# Patient Record
Sex: Male | Born: 1970
Health system: Southern US, Community
[De-identification: ages and names within clinical notes are randomized; demographics above are authoritative.]

## PROBLEM LIST (undated history)

## (undated) DIAGNOSIS — E785 Hyperlipidemia, unspecified: Secondary | ICD-10-CM

## (undated) HISTORY — PX: TYMPANOSTOMY TUBE PLACEMENT: SHX32

## (undated) HISTORY — DX: Hyperlipidemia, unspecified: E78.5

---

## 2006-08-19 ENCOUNTER — Emergency Department: Payer: Self-pay | Admitting: Emergency Medicine

## 2008-01-18 ENCOUNTER — Ambulatory Visit: Payer: Self-pay | Admitting: Internal Medicine

## 2008-11-18 ENCOUNTER — Ambulatory Visit: Payer: Self-pay | Admitting: Gastroenterology

## 2009-04-16 IMAGING — US ABDOMEN ULTRASOUND
1 series · 17 of 25 positions shown · non-contrast
Comparison: none

REASON FOR EXAM: elevated LFTs
COMMENTS:

[Series 1: abdomen ultrasound · 17 of 63 slices shown]
[im 1/63]
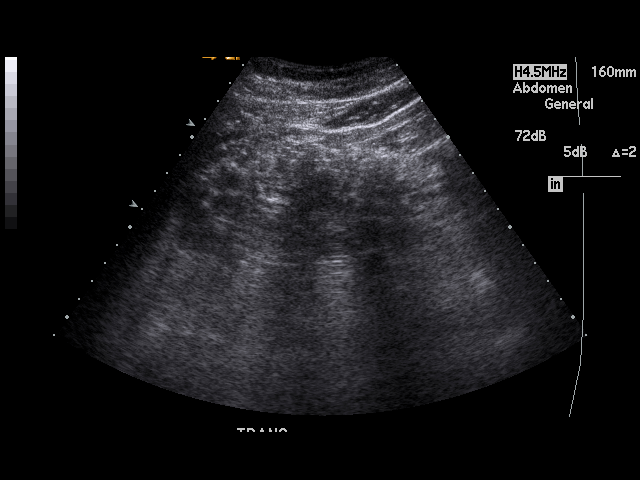
[im 6/63]
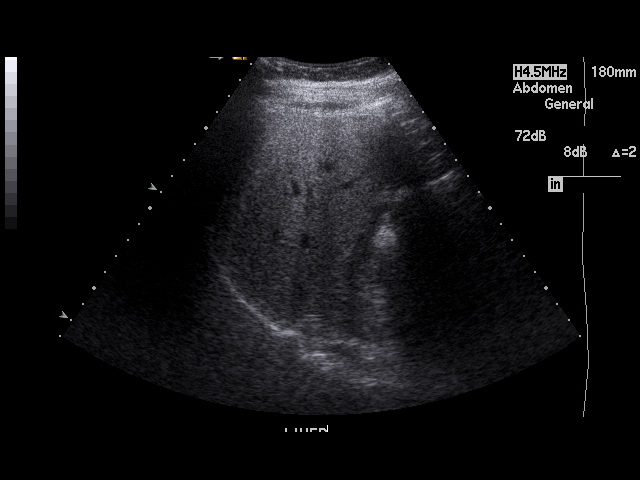
[im 8/63]
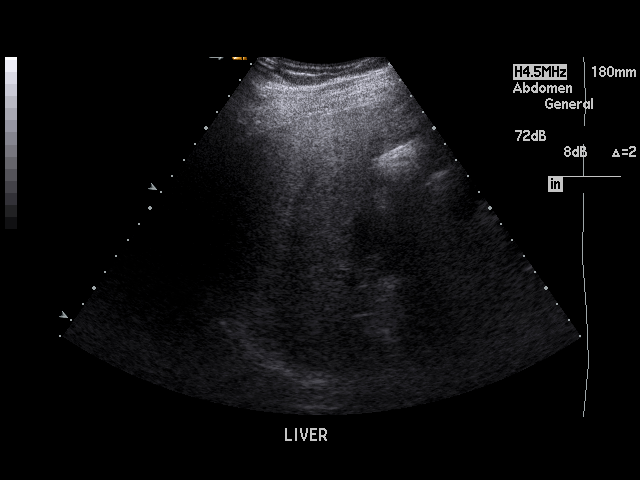
[im 13/63]
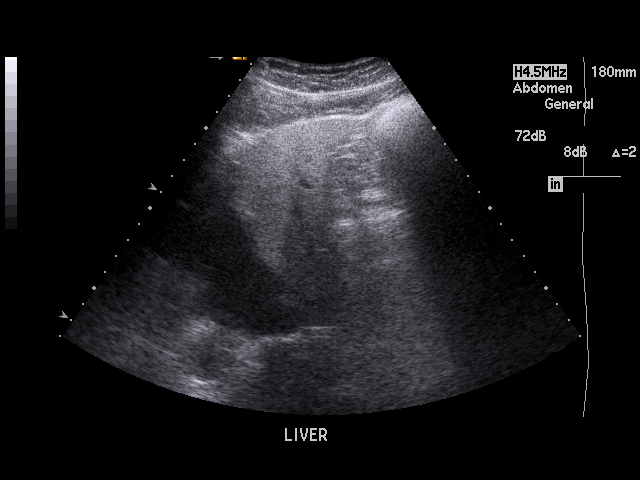
[im 16/63]
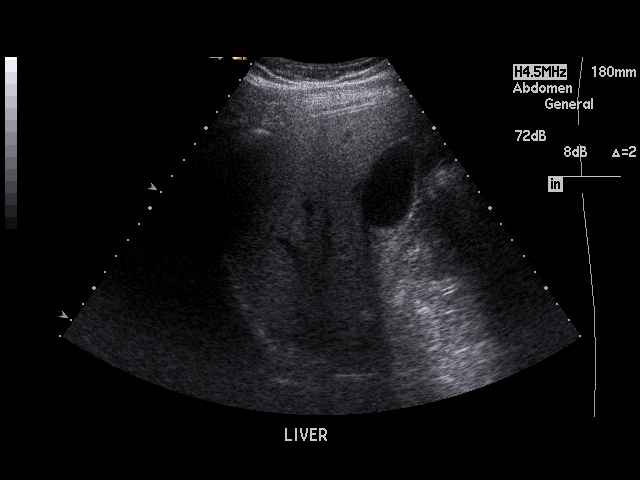
[im 21/63]
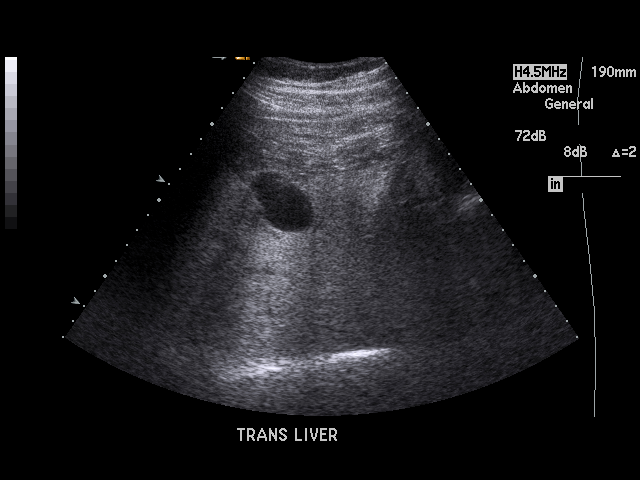
[im 24/63]
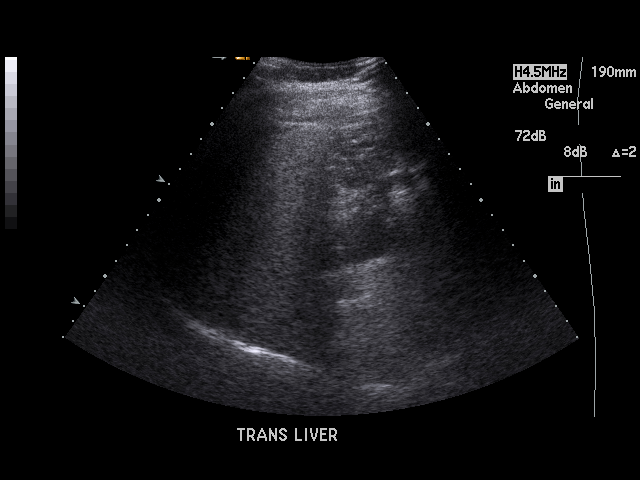
[im 29/63]
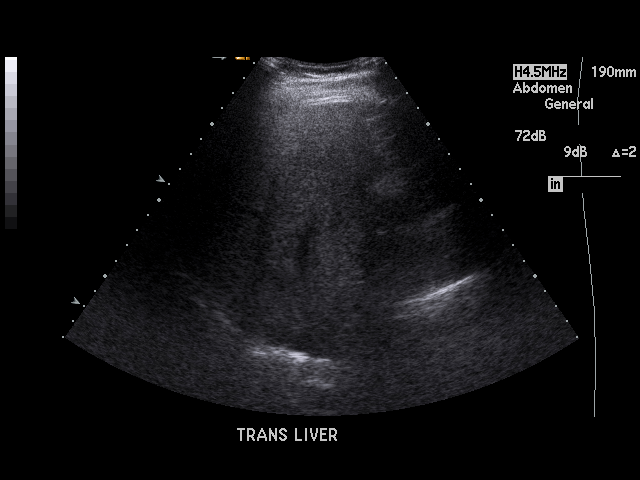
[im 32/63]
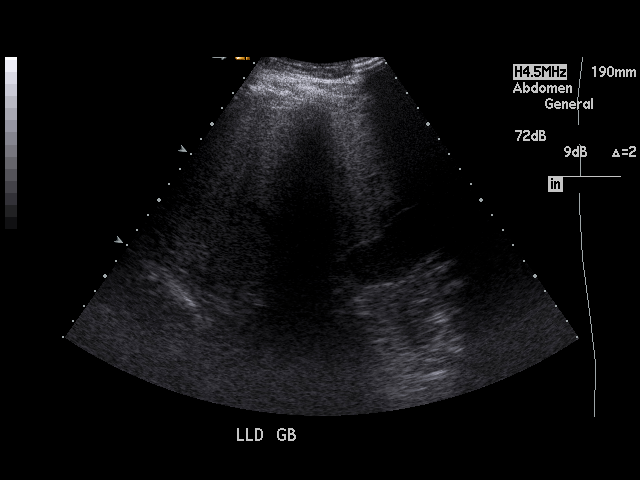
[im 34/63]
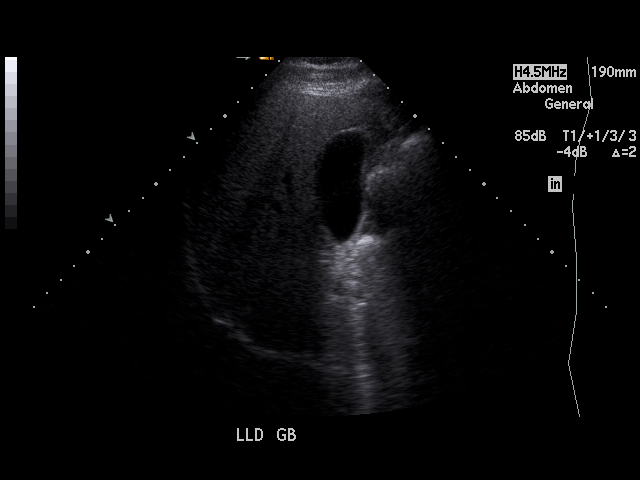
[im 39/63]
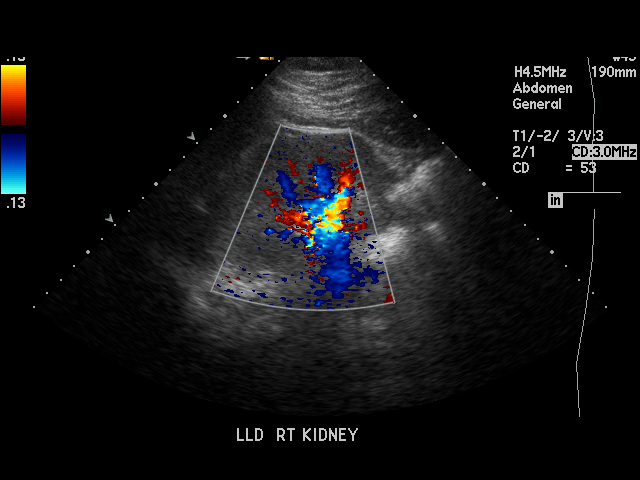
[im 42/63]
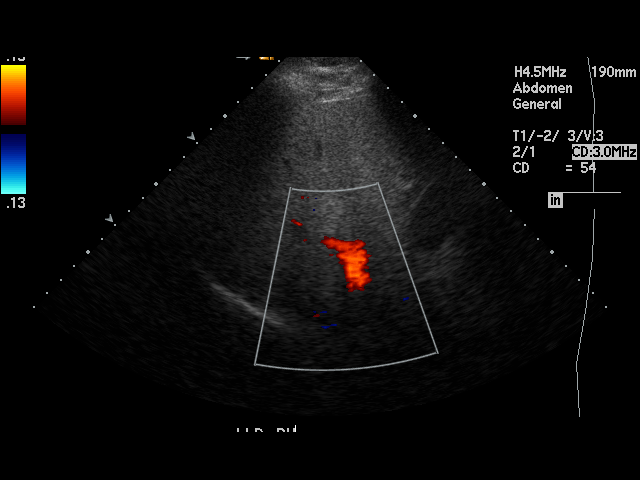
[im 47/63]
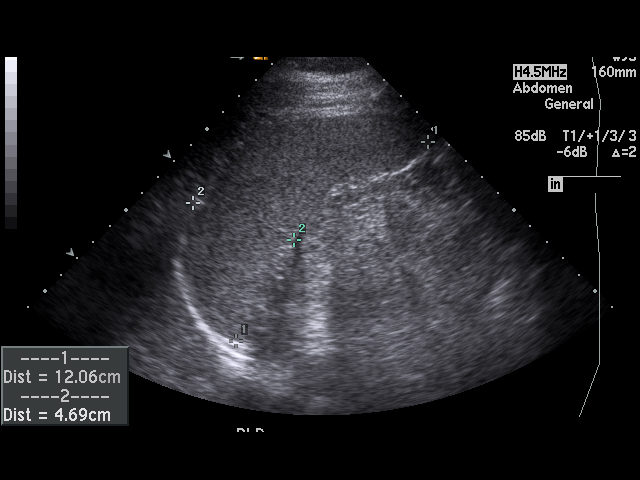
[im 50/63]
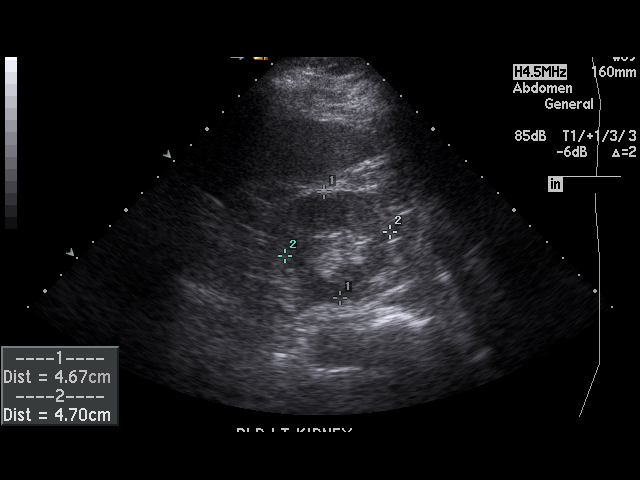
[im 55/63]
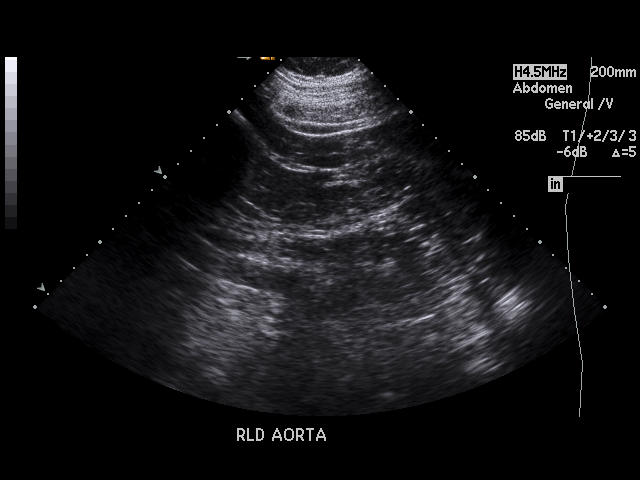
[im 57/63]
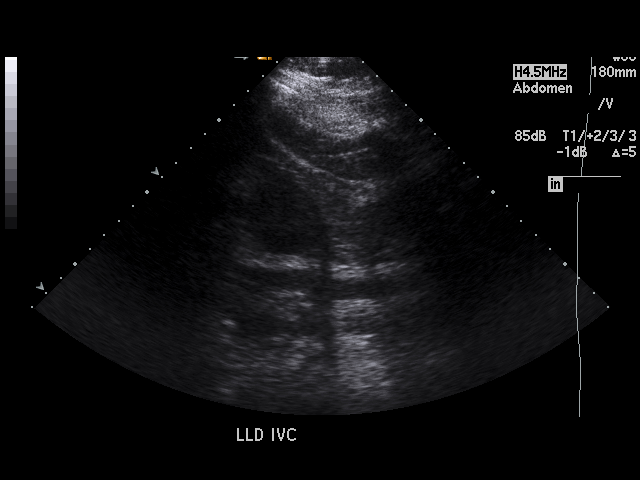
[im 63/63]
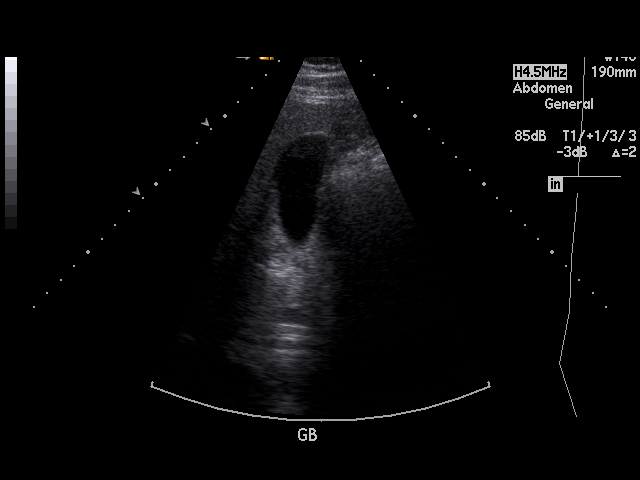

[17 of 25 positions shown; findings below may reference images not displayed]

PROCEDURE:     US  - US ABDOMEN GENERAL SURVEY  - January 18, 2008  [DATE]

RESULT:     Sonographic evaluation of the abdomen demonstrates
nonvisualization of the pancreas. The liver shows a slightly increased
echogenicity diffusely. The common bile duct diameter is 4.8 mm. The
gallbladder is normal in appearance with a 1 mm wall thickness. The spleen
is borderline enlarged at 12.5 cm in length. The inferior vena cava,
kidneys, and aorta appear to be unremarkable.
IMPRESSION: 1.     No evidence of cholelithiasis.
2.     Borderline splenomegaly. Nonvisualization of the pancreas. The liver
is slightly increased in echogenicity with no focal mass or hepatomegaly
demonstrated.

## 2012-08-20 ENCOUNTER — Ambulatory Visit: Payer: Self-pay | Admitting: Internal Medicine

## 2016-07-29 DIAGNOSIS — Z0001 Encounter for general adult medical examination with abnormal findings: Secondary | ICD-10-CM | POA: Diagnosis not present

## 2016-10-19 DIAGNOSIS — Z6827 Body mass index (BMI) 27.0-27.9, adult: Secondary | ICD-10-CM | POA: Diagnosis not present

## 2016-10-19 DIAGNOSIS — R29810 Facial weakness: Secondary | ICD-10-CM | POA: Diagnosis not present

## 2016-10-19 DIAGNOSIS — G510 Bell's palsy: Secondary | ICD-10-CM | POA: Diagnosis not present

## 2017-08-04 DIAGNOSIS — E782 Mixed hyperlipidemia: Secondary | ICD-10-CM | POA: Diagnosis not present

## 2017-08-04 DIAGNOSIS — G510 Bell's palsy: Secondary | ICD-10-CM | POA: Diagnosis not present

## 2017-08-04 DIAGNOSIS — Z0001 Encounter for general adult medical examination with abnormal findings: Secondary | ICD-10-CM | POA: Diagnosis not present

## 2017-10-06 ENCOUNTER — Ambulatory Visit: Payer: Self-pay | Admitting: Nurse Practitioner

## 2017-10-19 ENCOUNTER — Encounter: Payer: Self-pay | Admitting: Nurse Practitioner

## 2017-10-19 ENCOUNTER — Ambulatory Visit: Payer: BLUE CROSS/BLUE SHIELD | Admitting: Nurse Practitioner

## 2017-10-19 VITALS — BP 130/90 | HR 60 | Resp 16 | Ht 68.0 in | Wt 189.4 lb

## 2017-10-19 DIAGNOSIS — D485 Neoplasm of uncertain behavior of skin: Secondary | ICD-10-CM | POA: Diagnosis not present

## 2017-10-19 NOTE — Progress Notes (Addendum)
[facility] [place], [place] [address]  Internal MEDICINE  Office Visit Note  Patient Name: Joshua Cooke  [id number]  [id number]  Date of Service: 10/19/2017  Chief Complaint  Patient presents with  . Mass    right side forehear, near hair line    Other  This is a new problem. The current episode started 1 to 4 weeks ago. The problem occurs constantly. The problem has been gradually worsening. Pertinent negatives include no abdominal pain, arthralgias, chest pain, chills, congestion, coughing, headaches, myalgias, nausea, neck pain or vomiting. Nothing aggravates the symptoms. He has tried nothing for the symptoms.    Pt is here for routine follow up.    Current Medication: No outpatient encounter medications on file as of 10/19/2017.   No facility-administered encounter medications on file as of 10/19/2017.     Surgical History: Past Surgical History:  Procedure Laterality Date  . TYMPANOSTOMY TUBE PLACEMENT Bilateral     Medical History: Past Medical History:  Diagnosis Date  . Hyperlipidemia     Family History: Family History  Problem Relation Age of Onset  . Hyperlipidemia Father   . Heart disease Father     Social History   Socioeconomic History  . Marital status: Married    Spouse name: Not on file  . Number of children: Not on file  . Years of education: Not on file  . Highest education level: Not on file  Social Needs  . Financial resource strain: Not on file  . Food insecurity - worry: Not on file  . Food insecurity - inability: Not on file  . Transportation needs - medical: Not on file  . Transportation needs - non-medical: Not on file  Occupational History  . Not on file  Tobacco Use  . Smoking status: Never Smoker  . Smokeless tobacco: Never Used  Substance and Sexual Activity  . Alcohol use: Yes    Alcohol/week: 1.8 oz    Types: 3 Cans of beer per week    Frequency: Never  . Drug use: Not on file  . Sexual  activity: Not on file  Other Topics Concern  . Not on file  Social History Narrative  . Not on file      Review of Systems  Constitutional: Negative for activity change, chills and unexpected weight change.  HENT: Negative for congestion, postnasal drip, rhinorrhea and tinnitus.   Respiratory: Negative for cough and wheezing.   Cardiovascular: Negative for chest pain and palpitations.  Gastrointestinal: Negative for abdominal pain, diarrhea, nausea and vomiting.  Genitourinary: Negative.   Musculoskeletal: Negative for arthralgias, back pain, myalgias and neck pain.  Skin:       Skin lesion on right side of forehead, adjacent to hair line.   Allergic/Immunologic: Negative.   Neurological: Negative for headaches.  Psychiatric/Behavioral: Negative for agitation and dysphoric mood.    Today's Vitals   10/19/17 1039  BP: 130/90  Pulse: 60  Resp: 16  SpO2: 98%  Weight: 189 lb 6.4 oz (85.9 kg)  Height: 5\' 8"  (1.727 m)   Physical Exam  Constitutional: He is oriented to person, place, and time. He appears well-developed and well-nourished.  HENT:  Head: Normocephalic.  Eyes: Pupils are equal, round, and reactive to light.  Neck: Normal range of motion. Neck supple. No thyromegaly present.  Cardiovascular: Normal rate and regular rhythm.  Pulmonary/Chest: Effort normal and breath sounds normal. He has no wheezes.  Abdominal: Soft. There is no tenderness.  Musculoskeletal:  Normal range of motion.  Neurological: He is alert and oriented to person, place, and time.  Skin: Skin is warm and dry.     Psychiatric: He has a normal mood and affect. His behavior is normal. Judgment and thought content normal.    Assessment/Plan: 1. Neoplasm of uncertain behavior of skin of face - Ambulatory referral to Dermatology   General Counseling: [name] understanding of the findings of todays visit and agrees with plan of treatment. I have discussed any further diagnostic  evaluation that may be needed or ordered today. We also reviewed his medications today. he has been encouraged to call the office with any questions or concerns that should arise related to todays visit.   Orders Placed This Encounter  Procedures  . Ambulatory referral to Dermatology      Time spent: 10 Minutes    Dr Lavera Guise Internal medicine

## 2017-11-24 DIAGNOSIS — L820 Inflamed seborrheic keratosis: Secondary | ICD-10-CM | POA: Diagnosis not present

## 2017-11-24 DIAGNOSIS — D225 Melanocytic nevi of trunk: Secondary | ICD-10-CM | POA: Diagnosis not present

## 2017-11-24 DIAGNOSIS — D485 Neoplasm of uncertain behavior of skin: Secondary | ICD-10-CM | POA: Diagnosis not present

## 2017-11-24 DIAGNOSIS — L570 Actinic keratosis: Secondary | ICD-10-CM | POA: Diagnosis not present

## 2018-05-11 ENCOUNTER — Ambulatory Visit
Admission: RE | Admit: 2018-05-11 | Discharge: 2018-05-11 | Disposition: A | Discharge status: Home / Self Care | Payer: BLUE CROSS/BLUE SHIELD | Source: Ambulatory Visit | Attending: Adult Health | Admitting: Adult Health

## 2018-05-11 ENCOUNTER — Encounter: Payer: Self-pay | Admitting: Adult Health

## 2018-05-11 ENCOUNTER — Ambulatory Visit: Payer: BLUE CROSS/BLUE SHIELD | Admitting: Adult Health

## 2018-05-11 VITALS — BP 122/82 | HR 46 | Temp 98.0°F | Resp 16 | Ht 68.0 in | Wt 187.0 lb

## 2018-05-11 DIAGNOSIS — Z8669 Personal history of other diseases of the nervous system and sense organs: Secondary | ICD-10-CM | POA: Diagnosis not present

## 2018-05-11 DIAGNOSIS — E785 Hyperlipidemia, unspecified: Secondary | ICD-10-CM

## 2018-05-11 DIAGNOSIS — R221 Localized swelling, mass and lump, neck: Secondary | ICD-10-CM

## 2018-05-11 NOTE — Progress Notes (Signed)
[facility] [place], [place] [address]  Internal MEDICINE  Office Visit Note  Patient Name: Joshua Cooke  [id number]  [id number]  Date of Service: 05/27/2018  Chief Complaint  Patient presents with  . Cyst    upper back came up all of a sudden last Friday after using a bar for squats      HPI Pt is here for a sick visit.  Pt reports an area of swelling he describes as a growth to his neck, upper back area.  He reports his wife noticed this a week ago.  He denies growth of area since its initial discovery.  He denies pain with movement or palpation.  The area does not feel hot to him. He does go to the gym on thursdays and he did squat with a bar across his shoulder.  He does not recall an instance of pain during lifting.     Current Medication:  No outpatient encounter medications on file as of 05/11/2018.   No facility-administered encounter medications on file as of 05/11/2018.    Medical History: Past Medical History:  Diagnosis Date  . Hyperlipidemia    Vital Signs: BP 122/82   Pulse (!) 46   Temp 98 F (36.7 C)   Resp 16   Ht 5\' 8"  (1.727 m)   Wt 187 lb (84.8 kg)   SpO2 97%   BMI 28.43 kg/m   Review of Systems  Constitutional: Negative.  Negative for chills, fatigue and unexpected weight change.  HENT: Negative.  Negative for congestion, rhinorrhea, sneezing and sore throat.   Eyes: Negative for redness.  Respiratory: Negative.  Negative for cough, chest tightness and shortness of breath.   Cardiovascular: Negative.  Negative for chest pain and palpitations.  Gastrointestinal: Negative.  Negative for abdominal pain, constipation, diarrhea, nausea and vomiting.  Endocrine: Negative.   Genitourinary: Negative.  Negative for dysuria and frequency.  Musculoskeletal: Negative.  Negative for arthralgias, back pain, joint swelling and neck pain.  Skin: Negative.  Negative for rash.  Allergic/Immunologic: Negative.   Neurological:  Negative.  Negative for tremors and numbness.  Hematological: Negative for adenopathy. Does not bruise/bleed easily.  Psychiatric/Behavioral: Negative.  Negative for behavioral problems, sleep disturbance and suicidal ideas. The patient is not nervous/anxious.    Physical Exam  Constitutional: He is oriented to person, place, and time. He appears well-developed and well-nourished. No distress.  HENT:  Head: Normocephalic and atraumatic.  Mouth/Throat: Oropharynx is clear and moist. No oropharyngeal exudate.  Eyes: Pupils are equal, round, and reactive to light. EOM are normal.  Neck: Normal range of motion. Neck supple. No JVD present. No tracheal deviation present. No thyromegaly present.  Cardiovascular: Normal rate, regular rhythm and normal heart sounds. Exam reveals no gallop and no friction rub.  No murmur heard. Pulmonary/Chest: Effort normal and breath sounds normal. No respiratory distress. He has no wheezes. He has no rales. He exhibits no tenderness.  Abdominal: Soft. There is no tenderness. There is no guarding.  Musculoskeletal: Normal range of motion.  Lymphadenopathy:    He has no cervical adenopathy.  Neurological: He is alert and oriented to person, place, and time. No cranial nerve deficit.  Skin: Skin is warm and dry. He is not diaphoretic.  Psychiatric: He has a normal mood and affect. His behavior is normal. Judgment and thought content normal.  Nursing note and vitals reviewed.  Assessment/Plan: 1. Neck mass Small swelling, questionable lipoma or body habitus Will evaluate further.  -  DG Cervical Spine Complete; Future - US Soft Tissue Head/Neck; Future  2. Hyperlipidemia, unspecified hyperlipidemia type Stable with diet and exercise.   3. Hx of Bell's palsy Noticed physical residual effects, facial asymmetry.   General Counseling: calbert hulsebus understanding of the findings of todays visit and agrees with plan of treatment. I have discussed any further  diagnostic evaluation that may be needed or ordered today. We also reviewed his medications today. he has been encouraged to call the office with any questions or concerns that should arise related to todays visit.   Orders Placed This Encounter  Procedures  . US Soft Tissue Head/Neck  . DG Neck Soft Tissue    Time spent: 25 Minutes  This patient was seen by Orson Gear AGNP-C in Collaboration with Dr Lavera Guise as a part of collaborative care agreement

## 2018-05-11 NOTE — Patient Instructions (Addendum)
X-rays X-rays are tests that create pictures of the inside of your body using radiation. Different body parts absorb different amounts of radiation, which show up on the X-ray pictures in shades of black, gray, and white. X-rays are used to look for many health conditions, including broken bones, lung problems, and some types of cancer. Tell a health care provider about:  Any allergies you have.  All medicines you are taking, including vitamins, herbs, eye drops, creams, and over-the-counter medicines.  Previous surgeries you have had.  Medical conditions you have. What are the risks? Getting an X-ray is a safe procedure. What happens before the procedure?  Tell the X-ray technician if you are pregnant or might be pregnant.  You may be asked to wear a protective lead apron to hide parts of your body from the X-ray.  You usually will need to undress whatever part of your body needs the X-ray. You will be given a hospital gown to wear, if needed.  You may need to remove your glasses, jewelry, and other metal objects. What happens during the procedure?  The X-ray machine creates a picture by using a tiny burst of radiation. It is painless.  You may need to have several pictures taken at different angles.  You will need to try to be as still as you can during the examination to get the best possible images. What happens after the procedure?  You will be able to resume your normal activities.  The X-ray will be examined by your health care provider or a radiology specialist.  It is your responsibility to get your test results. Ask your health care provider when to expect your results and how to get them. This information is not intended to replace advice given to you by your health care provider. Make sure you discuss any questions you have with your health care provider. Document Released: 09/05/2005 Document Revised: 05/05/2016 Document Reviewed: 10/30/2013 Elsevier Interactive  Patient Education  2018 [employer].  Cholesterol Cholesterol is a fat. Your body needs a small amount of cholesterol. Cholesterol (plaque) may build up in your blood vessels (arteries). That makes you more likely to have a heart attack or stroke. You cannot feel your cholesterol level. Having a blood test is the only way to find out if your level is high. Keep your test results. Work with your doctor to keep your cholesterol at a good level. What do the results mean?  Total cholesterol is how much cholesterol is in your blood.  LDL is bad cholesterol. This is the type that can build up. Try to have low LDL.  HDL is good cholesterol. It cleans your blood vessels and carries LDL away. Try to have high HDL.  Triglycerides are fat that the body can store or burn for energy. What are good levels of cholesterol?  Total cholesterol below 200.  LDL below 100 is good for people who have health risks. LDL below 70 is good for people who have very high risks.  HDL above 40 is good. It is best to have HDL of 60 or higher.  Triglycerides below 150. How can I lower my cholesterol? Diet Follow your diet program as told by your doctor.  Choose fish, white meat chicken, or [place] that is roasted or baked. Try not to eat red meat, fried foods, sausage, or lunch meats.  Eat lots of fresh fruits and vegetables.  Choose whole grains, beans, pasta, potatoes, and cereals.  Choose olive oil, corn oil, or canola  oil. Only use small amounts.  Try not to eat butter, mayonnaise, shortening, or palm kernel oils.  Try not to eat foods with trans fats.  Choose low-fat or nonfat dairy foods. ? Drink skim or nonfat milk. ? Eat low-fat or nonfat yogurt and cheeses. ? Try not to drink whole milk or cream. ? Try not to eat ice cream, egg yolks, or full-fat cheeses.  Healthy desserts include angel food cake, ginger snaps, animal crackers, hard candy, popsicles, and low-fat or nonfat frozen yogurt. Try not  to eat pastries, cakes, pies, and cookies.  Exercise Follow your exercise program as told by your doctor.  Be more active. Try gardening, walking, and taking the stairs.  Ask your doctor about ways that you can be more active.  Medicine  Take over-the-counter and prescription medicines only as told by your doctor. This information is not intended to replace advice given to you by your health care provider. Make sure you discuss any questions you have with your health care provider. Document Released: 12/02/2008 Document Revised: 04/06/2016 Document Reviewed: 03/17/2016 Elsevier Interactive Patient Education  [employer].

## 2018-05-25 ENCOUNTER — Other Ambulatory Visit: Payer: BLUE CROSS/BLUE SHIELD

## 2018-05-28 ENCOUNTER — Ambulatory Visit: Payer: Self-pay | Admitting: Adult Health

## 2018-06-01 ENCOUNTER — Ambulatory Visit: Payer: BLUE CROSS/BLUE SHIELD

## 2018-06-01 DIAGNOSIS — R221 Localized swelling, mass and lump, neck: Secondary | ICD-10-CM

## 2018-06-04 ENCOUNTER — Ambulatory Visit: Payer: BLUE CROSS/BLUE SHIELD | Admitting: Adult Health

## 2018-06-04 ENCOUNTER — Encounter: Payer: Self-pay | Admitting: Adult Health

## 2018-06-04 VITALS — BP 128/78 | HR 50 | Resp 16 | Ht 68.0 in | Wt 184.4 lb

## 2018-06-04 DIAGNOSIS — R001 Bradycardia, unspecified: Secondary | ICD-10-CM | POA: Diagnosis not present

## 2018-06-04 DIAGNOSIS — E65 Localized adiposity: Secondary | ICD-10-CM

## 2018-06-04 DIAGNOSIS — E785 Hyperlipidemia, unspecified: Secondary | ICD-10-CM

## 2018-06-04 NOTE — Patient Instructions (Signed)
Lipoma A lipoma is a noncancerous (benign) tumor that is made up of fat cells. This is a very common type of soft-tissue growth. Lipomas are usually found under the skin (subcutaneous). They may occur in any tissue of the body that contains fat. Common areas for lipomas to appear include the back, shoulders, buttocks, and thighs. Lipomas grow slowly, and they are usually painless. Most lipomas do not cause problems and do not require treatment. What are the causes? The cause of this condition is not known. What increases the risk? This condition is more likely to develop in:  People who are 40-60 years old.  People who have a family history of lipomas.  What are the signs or symptoms? A lipoma usually appears as a small, round bump under the skin. It may feel soft or rubbery, but the firmness can vary. Most lipomas are not painful. However, a lipoma may become painful if it is located in an area where it pushes on nerves. How is this diagnosed? A lipoma can usually be diagnosed with a physical exam. You may also have tests to confirm the diagnosis and to rule out other conditions. Tests may include:  Imaging tests, such as a CT scan or MRI.  Removal of a tissue sample to be looked at under a microscope (biopsy).  How is this treated? Treatment is not needed for small lipomas that are not causing problems. If a lipoma continues to get bigger or it causes problems, removal is often the best option. Lipomas can also be removed to improve appearance. Removal of a lipoma is usually done with a surgery in which the fatty cells and the surrounding capsule are removed. Most often, a medicine that numbs the area (local anesthetic) is used for this procedure. Follow these instructions at home:  Keep all follow-up visits as directed by your health care provider. This is important. Contact a health care provider if:  Your lipoma becomes larger or hard.  Your lipoma becomes painful, red, or  increasingly swollen. These could be signs of infection or a more serious condition. This information is not intended to replace advice given to you by your health care provider. Make sure you discuss any questions you have with your health care provider. Document Released: 08/26/2002 Document Revised: 02/11/2016 Document Reviewed: 09/01/2014 Elsevier Interactive Patient Education  2018 Elsevier Inc.  

## 2018-06-04 NOTE — Progress Notes (Signed)
[facility] [place], [place] [address]  Internal MEDICINE  Office Visit Note  Patient Name: Joshua Cooke  [id number]  [id number]  Date of Service: 06/11/2018  Chief Complaint  Patient presents with  . Medical Management of Chronic Issues    follow up    HPI Pt here for follow up on X-ray and Ultrasound.  Pt has mass between his shoulder blades at the base of his neck. X-ray reports it is subcutaneous fat pad.  Pt denies other symptoms at this time.  The area appears to have diminished some in the last few weeks.  He denies pain, or difficulty with neck movement.  Current Medication: No outpatient encounter medications on file as of 06/04/2018.   No facility-administered encounter medications on file as of 06/04/2018.     Surgical History: Past Surgical History:  Procedure Laterality Date  . TYMPANOSTOMY TUBE PLACEMENT Bilateral     Medical History: Past Medical History:  Diagnosis Date  . Hyperlipidemia     Family History: Family History  Problem Relation Age of Onset  . Hyperlipidemia Father   . Heart disease Father     Social History   Socioeconomic History  . Marital status: Married    Spouse name: Not on file  . Number of children: Not on file  . Years of education: Not on file  . Highest education level: Not on file  Occupational History  . Not on file  Social Needs  . Financial resource strain: Not on file  . Food insecurity:    Worry: Not on file    Inability: Not on file  . Transportation needs:    Medical: Not on file    Non-medical: Not on file  Tobacco Use  . Smoking status: Never Smoker  . Smokeless tobacco: Never Used  Substance and Sexual Activity  . Alcohol use: Yes    Alcohol/week: 3.0 standard drinks    Types: 3 Cans of beer per week    Frequency: Never  . Drug use: Not on file  . Sexual activity: Not on file  Lifestyle  . Physical activity:    Days per week: Not on file    Minutes per session: Not on  file  . Stress: Not on file  Relationships  . Social connections:    Talks on phone: Not on file    Gets together: Not on file    Attends religious service: Not on file    Active member of club or organization: Not on file    Attends meetings of clubs or organizations: Not on file    Relationship status: Not on file  . Intimate partner violence:    Fear of current or ex partner: Not on file    Emotionally abused: Not on file    Physically abused: Not on file    Forced sexual activity: Not on file  Other Topics Concern  . Not on file  Social History Narrative  . Not on file      Review of Systems  Constitutional: Negative.  Negative for chills, fatigue and unexpected weight change.  HENT: Negative.  Negative for congestion, rhinorrhea, sneezing and sore throat.   Eyes: Negative for redness.  Respiratory: Negative.  Negative for cough, chest tightness and shortness of breath.   Cardiovascular: Negative.  Negative for chest pain and palpitations.  Gastrointestinal: Negative.  Negative for abdominal pain, constipation, diarrhea, nausea and vomiting.  Endocrine: Negative.   Genitourinary: Negative.  Negative for dysuria  and frequency.  Musculoskeletal: Negative.  Negative for arthralgias, back pain, joint swelling and neck pain.  Skin: Negative.  Negative for rash.  Allergic/Immunologic: Negative.   Neurological: Negative.  Negative for tremors and numbness.  Hematological: Negative for adenopathy. Does not bruise/bleed easily.  Psychiatric/Behavioral: Negative.  Negative for behavioral problems, sleep disturbance and suicidal ideas. The patient is not nervous/anxious.     Vital Signs: BP 128/78   Pulse (!) 50   Resp 16   Ht 5\' 8"  (1.727 m)   Wt 184 lb 6.4 oz (83.6 kg)   SpO2 97%   BMI 28.04 kg/m    Physical Exam  Constitutional: He is oriented to person, place, and time. He appears well-developed and well-nourished. No distress.  HENT:  Head: Normocephalic and  atraumatic.  Mouth/Throat: Oropharynx is clear and moist. No oropharyngeal exudate.  Eyes: Pupils are equal, round, and reactive to light. EOM are normal.  Neck: Normal range of motion. Neck supple. No JVD present. No tracheal deviation present. No thyromegaly present.  Cardiovascular: Normal rate, regular rhythm and normal heart sounds. Exam reveals no gallop and no friction rub.  No murmur heard. Pulmonary/Chest: Effort normal and breath sounds normal. No respiratory distress. He has no wheezes. He has no rales. He exhibits no tenderness.  Abdominal: Soft. There is no tenderness. There is no guarding.  Musculoskeletal: Normal range of motion.  Lymphadenopathy:    He has no cervical adenopathy.  Neurological: He is alert and oriented to person, place, and time. No cranial nerve deficit.  Skin: Skin is warm and dry. He is not diaphoretic.  Psychiatric: He has a normal mood and affect. His behavior is normal. Judgment and thought content normal.  Nursing note and vitals reviewed.  Assessment/Plan: 1. [place] hump Lab work to be drawn to assess for [place]. Also normal physical labs drawn for physical in November.  - Cortisol-am, blood - Catecholamines, Fractionated, Plasma - CBC with Differential/Platelet - Lipid Panel With LDL/HDL Ratio - TSH - T4, free - Comprehensive metabolic panel - PSA  2. Hyperlipidemia, unspecified hyperlipidemia type Will follow lab results.   3. Bradycardia Noted at visit today.  Pt runs 13 miles a week and cycles so is cardiovascular fit.   General Counseling: kedrick mcnamee understanding of the findings of todays visit and agrees with plan of treatment. I have discussed any further diagnostic evaluation that may be needed or ordered today. We also reviewed his medications today. he has been encouraged to call the office with any questions or concerns that should arise related to todays visit.    Orders Placed This Encounter  Procedures  .  Cortisol-am, blood  . Catecholamines, Fractionated, Plasma  . CBC with Differential/Platelet  . Lipid Panel With LDL/HDL Ratio  . TSH  . T4, free  . Comprehensive metabolic panel  . PSA     Time spent: 25 Minutes   This patient was seen by Orson Gear AGNP-C in Collaboration with Dr Lavera Guise as a part of collaborative care agreement    Dr Lavera Guise Internal medicine

## 2018-07-31 DIAGNOSIS — Z0001 Encounter for general adult medical examination with abnormal findings: Secondary | ICD-10-CM | POA: Diagnosis not present

## 2018-08-03 LAB — CBC WITH DIFFERENTIAL/PLATELET
BASOS ABS: 0 10*3/uL (ref 0.0–0.2)
Basos: 1 %
EOS (ABSOLUTE): 0.2 10*3/uL (ref 0.0–0.4)
EOS: 5 %
HEMOGLOBIN: 14.5 g/dL (ref 13.0–17.7)
Hematocrit: 41.5 % (ref 37.5–51.0)
IMMATURE GRANS (ABS): 0 10*3/uL (ref 0.0–0.1)
IMMATURE GRANULOCYTES: 0 %
LYMPHS ABS: 0.9 10*3/uL (ref 0.7–3.1)
LYMPHS: 24 %
MCH: 32 pg (ref 26.6–33.0)
MCHC: 34.9 g/dL (ref 31.5–35.7)
MCV: 92 fL (ref 79–97)
Monocytes Absolute: 0.3 10*3/uL (ref 0.1–0.9)
Monocytes: 7 %
NEUTROS PCT: 63 %
Neutrophils Absolute: 2.2 10*3/uL (ref 1.4–7.0)
Platelets: 173 10*3/uL (ref 150–450)
RBC: 4.53 x10E6/uL (ref 4.14–5.80)
RDW: 11.7 % — ABNORMAL LOW (ref 12.3–15.4)
WBC: 3.5 10*3/uL (ref 3.4–10.8)

## 2018-08-03 LAB — COMPREHENSIVE METABOLIC PANEL
A/G RATIO: 1.9 (ref 1.2–2.2)
ALBUMIN: 4.5 g/dL (ref 3.5–5.5)
ALT: 34 IU/L (ref 0–44)
AST: 40 IU/L (ref 0–40)
Alkaline Phosphatase: 62 IU/L (ref 39–117)
BUN / CREAT RATIO: 14 (ref 9–20)
BUN: 16 mg/dL (ref 6–24)
Bilirubin Total: 0.8 mg/dL (ref 0.0–1.2)
CALCIUM: 9.3 mg/dL (ref 8.7–10.2)
CO2: 23 mmol/L (ref 20–29)
Chloride: 103 mmol/L (ref 96–106)
Creatinine, Ser: 1.14 mg/dL (ref 0.76–1.27)
GFR calc Af Amer: 87 mL/min/{1.73_m2} (ref >59)
GFR, EST NON AFRICAN AMERICAN: 75 mL/min/{1.73_m2} (ref >59)
GLUCOSE: 95 mg/dL (ref 65–99)
Globulin, Total: 2.4 g/dL (ref 1.5–4.5)
Potassium: 4.5 mmol/L (ref 3.5–5.2)
Sodium: 140 mmol/L (ref 134–144)
TOTAL PROTEIN: 6.9 g/dL (ref 6.0–8.5)

## 2018-08-03 LAB — CORTISOL-AM, BLOOD: Cortisol - AM: 11 ug/dL (ref 6.2–19.4)

## 2018-08-03 LAB — LIPID PANEL WITH LDL/HDL RATIO
CHOLESTEROL TOTAL: 160 mg/dL (ref 100–199)
HDL: 53 mg/dL (ref >39)
LDL CALC: 94 mg/dL (ref 0–99)
LDl/HDL Ratio: 1.8 ratio (ref 0.0–3.6)
TRIGLYCERIDES: 64 mg/dL (ref 0–149)
VLDL Cholesterol Cal: 13 mg/dL (ref 5–40)

## 2018-08-03 LAB — CATECHOLAMINES, FRACTIONATED, PLASMA
Dopamine: <30 pg/mL (ref 0–48)
EPINEPHRINE: 52 pg/mL (ref 0–62)
Norepinephrine: 462 pg/mL (ref 0–874)

## 2018-08-03 LAB — PSA: Prostate Specific Ag, Serum: 0.6 ng/mL (ref 0.0–4.0)

## 2018-08-03 LAB — TSH: TSH: 1.71 u[IU]/mL (ref 0.450–4.500)

## 2018-08-03 LAB — T4, FREE: FREE T4: 1.08 ng/dL (ref 0.82–1.77)

## 2018-08-06 ENCOUNTER — Ambulatory Visit (INDEPENDENT_AMBULATORY_CARE_PROVIDER_SITE_OTHER): Payer: BLUE CROSS/BLUE SHIELD | Admitting: Nurse Practitioner

## 2018-08-06 ENCOUNTER — Encounter: Payer: Self-pay | Admitting: Nurse Practitioner

## 2018-08-06 VITALS — BP 129/74 | HR 44 | Resp 16 | Ht 68.0 in | Wt 175.0 lb

## 2018-08-06 DIAGNOSIS — E785 Hyperlipidemia, unspecified: Secondary | ICD-10-CM

## 2018-08-06 DIAGNOSIS — D170 Benign lipomatous neoplasm of skin and subcutaneous tissue of head, face and neck: Secondary | ICD-10-CM

## 2018-08-06 DIAGNOSIS — Z0001 Encounter for general adult medical examination with abnormal findings: Secondary | ICD-10-CM | POA: Diagnosis not present

## 2018-08-06 NOTE — Progress Notes (Signed)
[facility] [place], [place] [address]  Internal MEDICINE  Office Visit Note  Patient Name: Joshua Cooke  [id number]  [id number]  Date of Service: 08/06/2018   Pt is here for routine health maintenance examination  Chief Complaint  Patient presents with  . Hyperlipidemia  . Annual Exam  . Other    growth on neck      The patient is here for health maintenance exam. He was seen back in September for development of a lump at the base of his neck. Has not enlarged, but does get in the way of him flexing and extending his neck. He did have ultrasound of this area and results are consistent with lipoma. Labs were also checked, evaluating him for cushing's and/or Addison's disease. Lab results were all normal. When compared to labs done through his place of employment, his liver functions and cholesterol panels had returned to normal. He states that he has cut back on the amount of beer he drinks. Not only did his labs improve, he dropped nine pounds. Besides the lump at the base of his neck, he has not concerns or complaints today.     Current Medication: No outpatient encounter medications on file as of 08/06/2018.   No facility-administered encounter medications on file as of 08/06/2018.     Surgical History: Past Surgical History:  Procedure Laterality Date  . TYMPANOSTOMY TUBE PLACEMENT Bilateral     Medical History: Past Medical History:  Diagnosis Date  . Hyperlipidemia     Family History: Family History  Problem Relation Age of Onset  . Hyperlipidemia Father   . Heart disease Father       Review of Systems  Constitutional: Positive for unexpected weight change. Negative for activity change, chills and fatigue.       Weight loss of nine pounds since his last visit.   HENT: Negative for congestion, rhinorrhea, sneezing and sore throat.   Eyes: Negative for redness.  Respiratory: Negative for cough, chest tightness, shortness of  breath and wheezing.   Cardiovascular: Negative for chest pain and palpitations.  Gastrointestinal: Negative for abdominal pain, constipation, diarrhea, nausea and vomiting.  Endocrine: Negative for cold intolerance, heat intolerance, polydipsia, polyphagia and polyuria.  Genitourinary: Negative.   Musculoskeletal: Positive for neck pain. Negative for arthralgias, back pain and joint swelling.  Skin: Negative for rash.  Allergic/Immunologic: Negative for environmental allergies.  Neurological: Negative for dizziness, tremors, numbness and headaches.  Hematological: Negative for adenopathy. Does not bruise/bleed easily.  Psychiatric/Behavioral: Negative for behavioral problems, dysphoric mood, sleep disturbance and suicidal ideas. The patient is not nervous/anxious.      Today's Vitals   08/06/18 1032  BP: 129/74  Pulse: (!) 44  Resp: 16  SpO2: 99%  Weight: 175 lb (79.4 kg)  Height: 5\' 8"  (1.727 m)    Physical Exam  Constitutional: He is oriented to person, place, and time. He appears well-developed and well-nourished. No distress.  HENT:  Head: Normocephalic and atraumatic.  Nose: Nose normal.  Mouth/Throat: No oropharyngeal exudate.  Eyes: Pupils are equal, round, and reactive to light. EOM are normal.  Neck: Normal range of motion. Neck supple. No JVD present. No tracheal deviation present. No thyromegaly present.    Cardiovascular: Normal rate, regular rhythm, normal heart sounds and intact distal pulses. Exam reveals no gallop and no friction rub.  No murmur heard. Pulmonary/Chest: Effort normal and breath sounds normal. No respiratory distress. He has no wheezes. He has no rales. He  exhibits no tenderness.  Abdominal: Soft. Bowel sounds are normal. There is no tenderness.  Musculoskeletal: Normal range of motion.  Lymphadenopathy:    He has no cervical adenopathy.  Neurological: He is alert and oriented to person, place, and time. No cranial nerve deficit.  Skin: Skin  is warm and dry. He is not diaphoretic.  Psychiatric: He has a normal mood and affect. His behavior is normal. Judgment and thought content normal.  Nursing note and vitals reviewed.    LABS: Recent Results (from the past 2160 hour(s))  Cortisol-am, blood     Status: None   Collection Time: 07/31/18  8:41 AM  Result Value Ref Range   Cortisol - AM 11.0 6.2 - 19.4 ug/dL  Catecholamines, Fractionated, Plasma     Status: None   Collection Time: 07/31/18  8:41 AM  Result Value Ref Range   Norepinephrine 462 0 - 874 pg/mL   Epinephrine 52 0 - 62 pg/mL   Dopamine <30 0 - 48 pg/mL  CBC with Differential/Platelet     Status: Abnormal   Collection Time: 07/31/18  8:41 AM  Result Value Ref Range   WBC 3.5 3.4 - 10.8 x10E3/uL   RBC 4.53 4.14 - 5.80 x10E6/uL   Hemoglobin 14.5 13.0 - 17.7 g/dL   Hematocrit 41.5 37.5 - 51.0 %   MCV 92 79 - 97 fL   MCH 32.0 26.6 - 33.0 pg   MCHC 34.9 31.5 - 35.7 g/dL   RDW 11.7 (L) 12.3 - 15.4 %   Platelets 173 150 - 450 x10E3/uL   Neutrophils 63 Not Estab. %   Lymphs 24 Not Estab. %   Monocytes 7 Not Estab. %   Eos 5 Not Estab. %   Basos 1 Not Estab. %   Neutrophils Absolute 2.2 1.4 - 7.0 x10E3/uL   Lymphocytes Absolute 0.9 0.7 - 3.1 x10E3/uL   Monocytes Absolute 0.3 0.1 - 0.9 x10E3/uL   EOS (ABSOLUTE) 0.2 0.0 - 0.4 x10E3/uL   Basophils Absolute 0.0 0.0 - 0.2 x10E3/uL   Immature Granulocytes 0 Not Estab. %   Immature Grans (Abs) 0.0 0.0 - 0.1 x10E3/uL  Lipid Panel With LDL/HDL Ratio     Status: None   Collection Time: 07/31/18  8:41 AM  Result Value Ref Range   Cholesterol, Total 160 100 - 199 mg/dL   Triglycerides 64 0 - 149 mg/dL   HDL 53 >39 mg/dL   VLDL Cholesterol Cal 13 5 - 40 mg/dL   LDL Calculated 94 0 - 99 mg/dL   LDl/HDL Ratio 1.8 0.0 - 3.6 ratio    Comment:                                     LDL/HDL Ratio                                             Men  Women                               1/2 Avg.Risk  1.0    1.5  Avg.Risk  3.6    3.2                                2X Avg.Risk  6.2    5.0                                3X Avg.Risk  8.0    6.1   TSH     Status: None   Collection Time: 07/31/18  8:41 AM  Result Value Ref Range   TSH 1.710 0.450 - 4.500 uIU/mL  T4, free     Status: None   Collection Time: 07/31/18  8:41 AM  Result Value Ref Range   Free T4 1.08 0.82 - 1.77 ng/dL  Comprehensive metabolic panel     Status: None   Collection Time: 07/31/18  8:41 AM  Result Value Ref Range   Glucose 95 65 - 99 mg/dL   BUN 16 6 - 24 mg/dL   Creatinine, Ser 1.14 0.76 - 1.27 mg/dL   GFR calc non Af Amer 75 >59 mL/min/1.73   GFR calc Af Amer 87 >59 mL/min/1.73   BUN/Creatinine Ratio 14 9 - 20   Sodium 140 134 - 144 mmol/L   Potassium 4.5 3.5 - 5.2 mmol/L   Chloride 103 96 - 106 mmol/L   CO2 23 20 - 29 mmol/L   Calcium 9.3 8.7 - 10.2 mg/dL   Total Protein 6.9 6.0 - 8.5 g/dL   Albumin 4.5 3.5 - 5.5 g/dL   Globulin, Total 2.4 1.5 - 4.5 g/dL   Albumin/Globulin Ratio 1.9 1.2 - 2.2   Bilirubin Total 0.8 0.0 - 1.2 mg/dL   Alkaline Phosphatase 62 39 - 117 IU/L   AST 40 0 - 40 IU/L   ALT 34 0 - 44 IU/L  PSA     Status: None   Collection Time: 07/31/18  8:41 AM  Result Value Ref Range   Prostate Specific Ag, Serum 0.6 0.0 - 4.0 ng/mL    Comment: Roche ECLIA methodology. According to the American Urological Association, Serum PSA should decrease and remain at undetectable levels after radical prostatectomy. The AUA defines biochemical recurrence as an initial PSA value 0.2 ng/mL or greater followed by a subsequent confirmatory PSA value 0.2 ng/mL or greater. Values obtained with different assay methods or kits cannot be used interchangeably. Results cannot be interpreted as absolute evidence of the presence or absence of malignant disease.    Assessment/Plan: 1. Encounter for general adult medical examination with abnormal findings Annual health maintenance exam today - UA/M w/rflx  Culture, Routine  2. Lipoma of neck Reviewed results of soft tissue ultrasound of the neck. Revealed lipoma at area of concern. Since it is interfering with normal flexion and extension of the neck, will refer to general surgery for further evaluation and treatment.  - Ambulatory referral to General Surgery  3. Hyperlipidemia, unspecified hyperlipidemia type New labs show normal lipid panel. Continue with diet and lifestyle changes. Monitor closely.   General Counseling: fowler antos understanding of the findings of todays visit and agrees with plan of treatment. I have discussed any further diagnostic evaluation that may be needed or ordered today. We also reviewed his medications today. he has been encouraged to call the office with any questions or concerns that should arise related to todays visit.    Counseling:  Cardiac risk factor modification:  1. Control blood pressure. 2. Exercise as prescribed. 3. Follow low sodium, low fat diet. and low fat and low cholestrol diet. 4. Take ASA 81mg  once a day. 5. Restricted calories diet to lose weight.  This patient was seen by Leretha Pol FNP Collaboration with Dr Lavera Guise as a part of collaborative care agreement  Orders Placed This Encounter  Procedures  . UA/M w/rflx Culture, Routine  . Ambulatory referral to General Surgery    Time spent: [place], MD  Internal Medicine

## 2018-08-07 LAB — UA/M W/RFLX CULTURE, ROUTINE
Bilirubin, UA: NEGATIVE
Glucose, UA: NEGATIVE
Ketones, UA: NEGATIVE
Leukocytes, UA: NEGATIVE
Nitrite, UA: NEGATIVE
PH UA: 6 (ref 5.0–7.5)
Protein, UA: NEGATIVE
RBC, UA: NEGATIVE
Specific Gravity, UA: 1.016 (ref 1.005–1.030)
Urobilinogen, Ur: 0.2 mg/dL (ref 0.2–1.0)

## 2018-08-07 LAB — MICROSCOPIC EXAMINATION
BACTERIA UA: NONE SEEN
CASTS: NONE SEEN /LPF
Epithelial Cells (non renal): NONE SEEN /hpf (ref 0–10)
WBC UA: NONE SEEN /HPF (ref 0–5)

## 2018-08-20 DIAGNOSIS — R221 Localized swelling, mass and lump, neck: Secondary | ICD-10-CM | POA: Diagnosis not present

## 2018-08-20 DIAGNOSIS — Z6826 Body mass index (BMI) 26.0-26.9, adult: Secondary | ICD-10-CM | POA: Diagnosis not present

## 2019-08-07 ENCOUNTER — Telehealth: Payer: Self-pay

## 2019-08-07 NOTE — Telephone Encounter (Signed)
LMOM TO CONFIRM AND SCREEN FOR 08-09-19 OV.

## 2019-08-08 ENCOUNTER — Telehealth: Payer: Self-pay

## 2019-08-08 IMAGING — CR DG NECK SOFT TISSUE
1 series · 3 of 3 positions shown · non-contrast
Comparison: None.

CLINICAL DATA: Mass posterior neck.

EXAM:
NECK SOFT TISSUES - 1+ VIEW

[Series 1: dg neck soft tissue · 0.14mm/px · 3 of 3 slices shown]
[im 1/3]
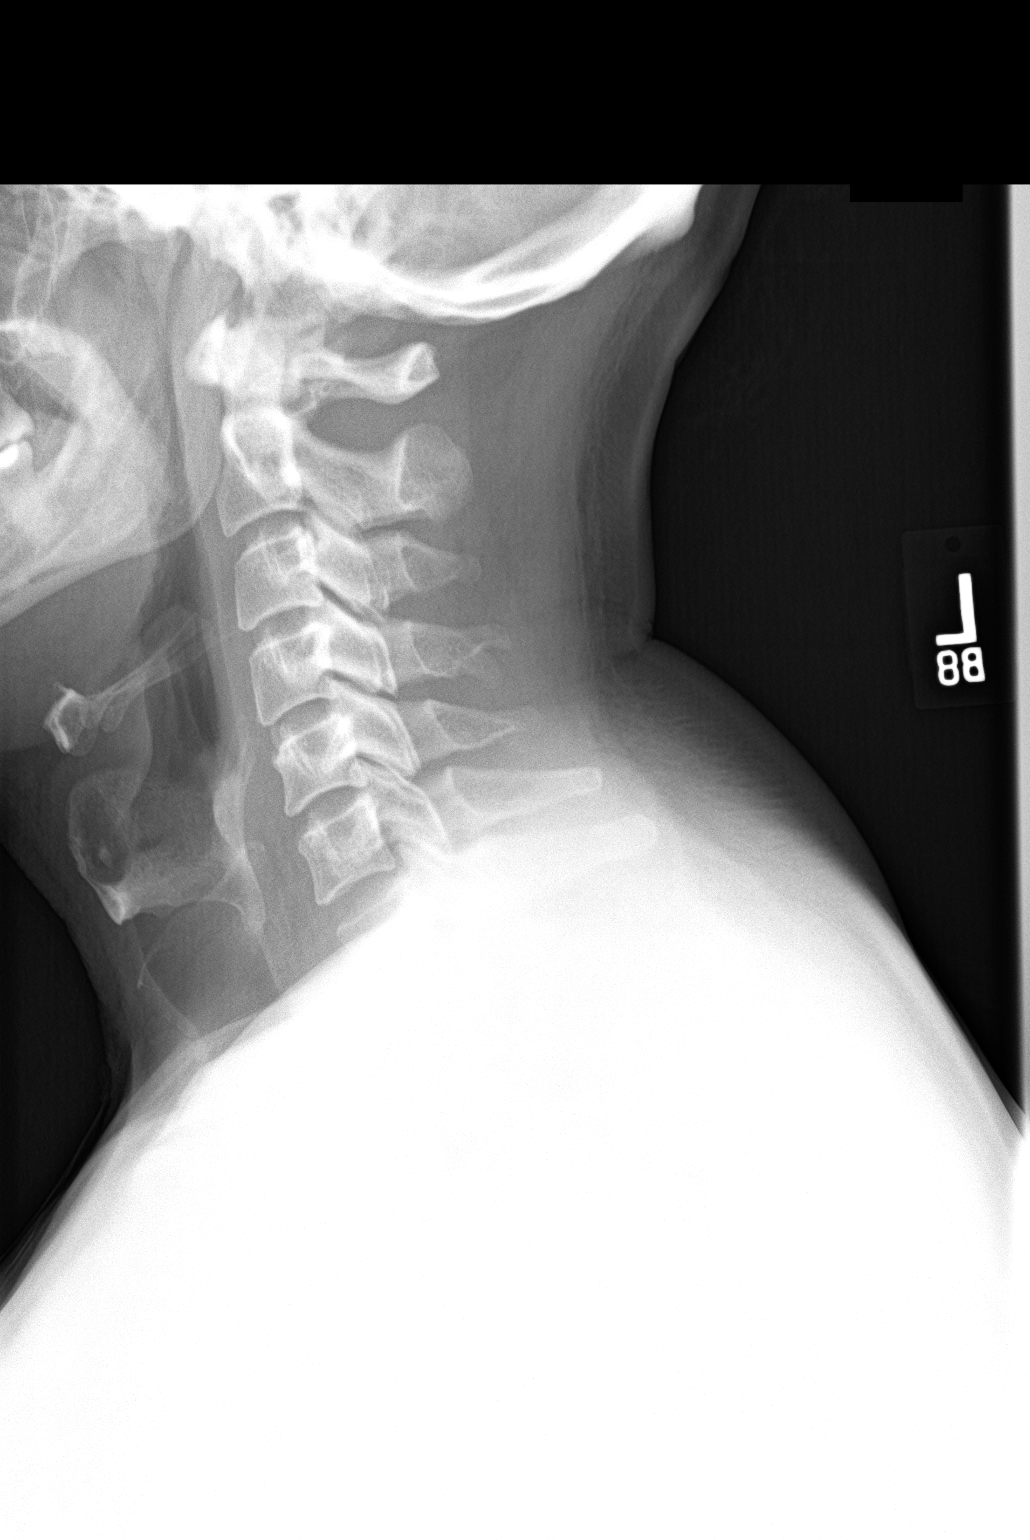
[im 2/3]
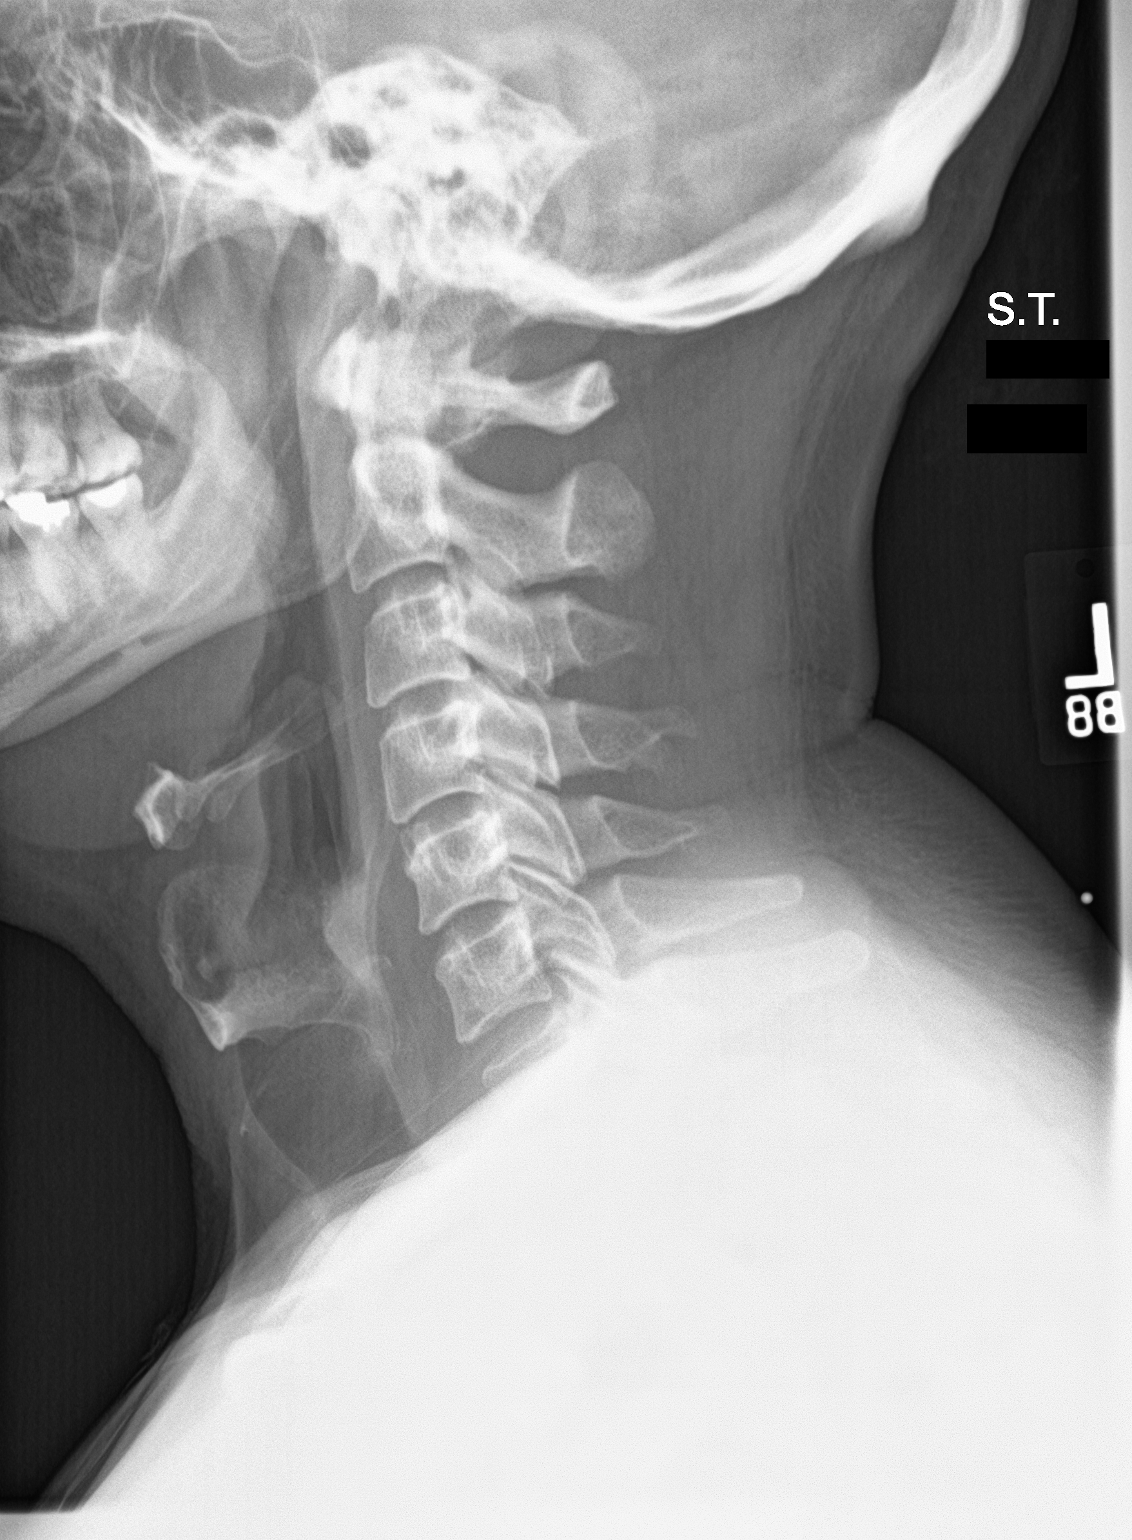
[im 3/3]
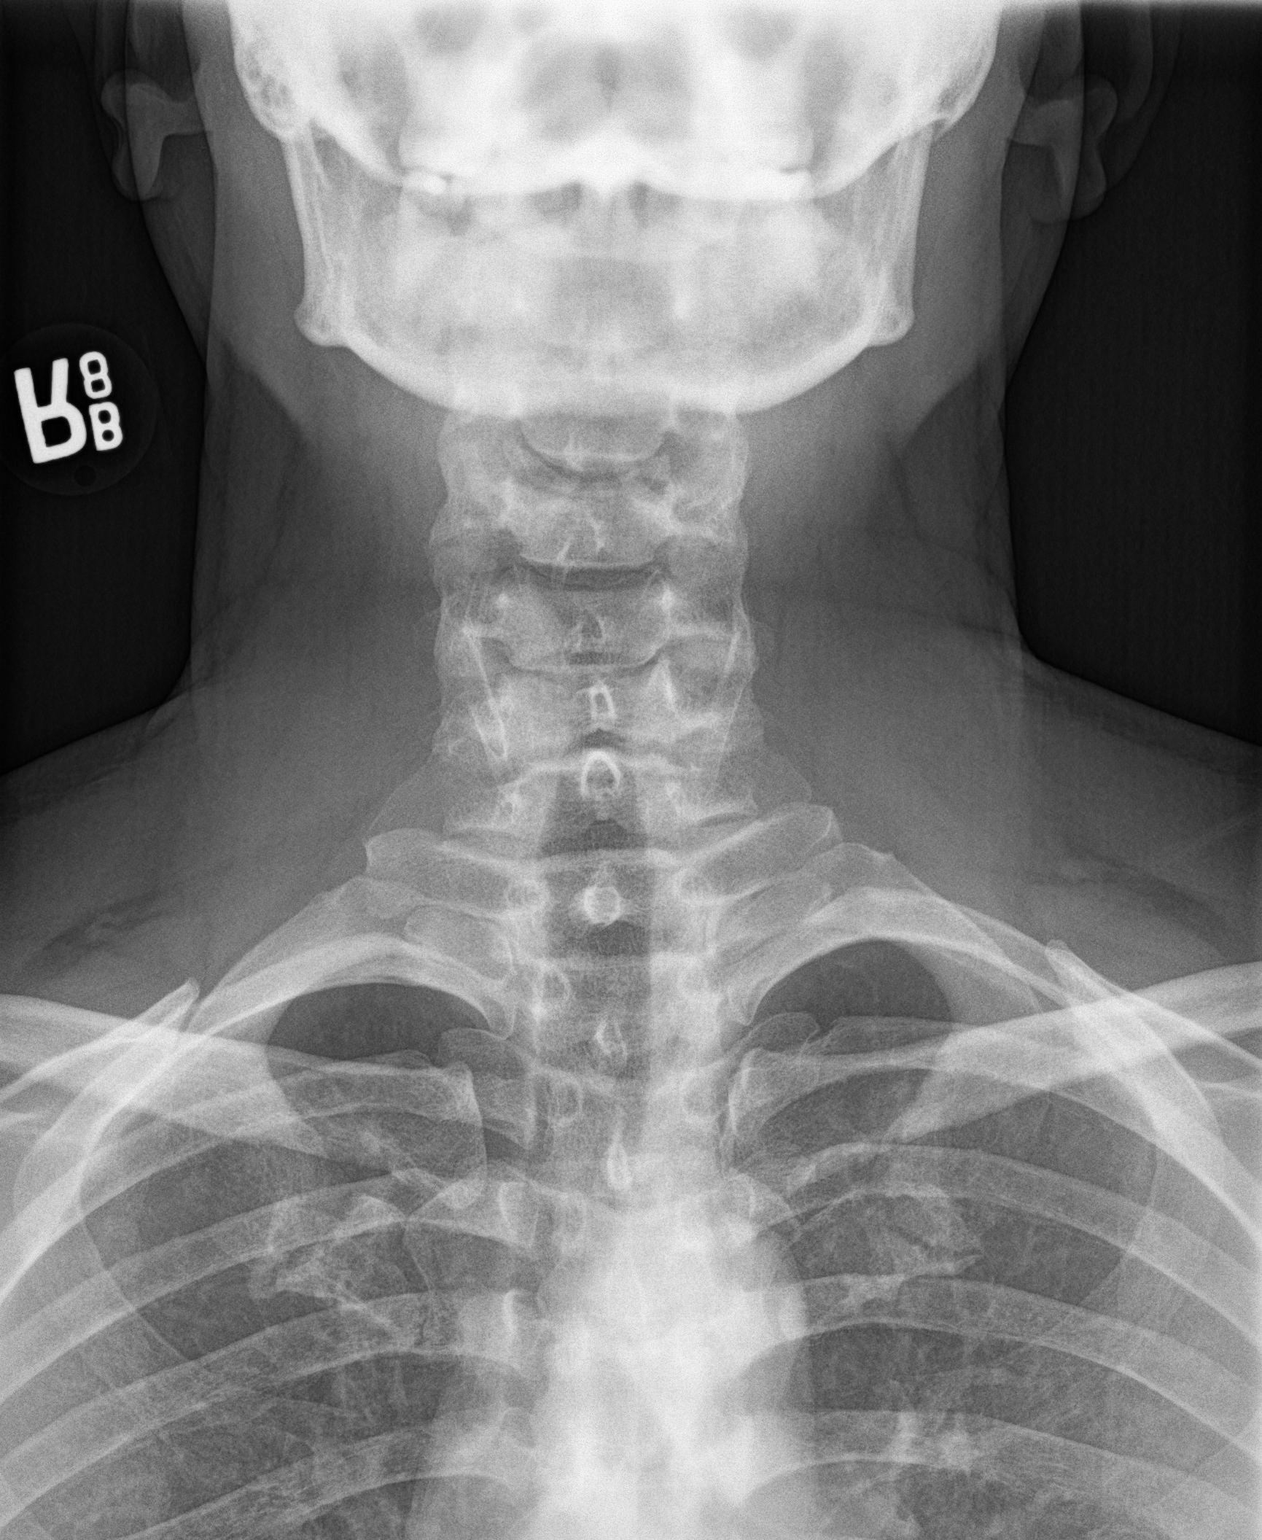

[3 of 3 positions shown; findings below may reference images not displayed]

FINDINGS: A BB overlaps prominence of subcutaneous fat over the dorsal upper
back. No underlying detectable mass or bony protuberance.
Prevertebral soft tissues are unremarkable. No osseous findings.
IMPRESSION: No evidence of mass. Symptom marker overlaps prominent subcutaneous
fat over the posterior upper back-consider [REDACTED]
hump/lipodystrophy.

## 2019-08-08 NOTE — Telephone Encounter (Signed)
Confirmed appointment with patient. klh °

## 2019-08-09 ENCOUNTER — Encounter (INDEPENDENT_AMBULATORY_CARE_PROVIDER_SITE_OTHER): Payer: Self-pay

## 2019-08-09 ENCOUNTER — Encounter: Payer: Self-pay | Admitting: Nurse Practitioner

## 2019-08-09 ENCOUNTER — Ambulatory Visit (INDEPENDENT_AMBULATORY_CARE_PROVIDER_SITE_OTHER): Payer: BC Managed Care – PPO | Admitting: Nurse Practitioner

## 2019-08-09 ENCOUNTER — Other Ambulatory Visit: Payer: Self-pay

## 2019-08-09 VITALS — BP 141/82 | HR 48 | Temp 98.0°F | Resp 16 | Ht 68.0 in | Wt 183.2 lb

## 2019-08-09 DIAGNOSIS — R300 Dysuria: Secondary | ICD-10-CM | POA: Diagnosis not present

## 2019-08-09 DIAGNOSIS — R55 Syncope and collapse: Secondary | ICD-10-CM | POA: Diagnosis not present

## 2019-08-09 DIAGNOSIS — I951 Orthostatic hypotension: Secondary | ICD-10-CM | POA: Diagnosis not present

## 2019-08-09 DIAGNOSIS — Z0001 Encounter for general adult medical examination with abnormal findings: Secondary | ICD-10-CM

## 2019-08-09 NOTE — Progress Notes (Signed)
[facility] [place], [place] [address]  Internal MEDICINE  Office Visit Note  Patient Name: Joshua Cooke  [id number]  [id number]  Date of Service: 08/18/2019   Pt is here for routine health maintenance examination  Chief Complaint  Patient presents with  . Annual Exam  . Quality Metric Gaps    colonoscopy   . Dizziness    for the past two days when getting out of bed in the mornings       The patient is here for health maintenance exam. He states that he has had three episodes of dizziness in the past 2 days. He states that episodes occur when he stands up after resting. Last for a few seconds then resolve on their own. He denies chest pain, chest pressure, or shortness of breath. Has had not problems with balance. He is active runner. Worked out yesterday and ran the day before and had no problems. He does have low heart rate at 48. This is normal heart rate for him as he is very active.     Current Medication: No outpatient encounter medications on file as of 08/09/2019.   No facility-administered encounter medications on file as of 08/09/2019.     Surgical History: Past Surgical History:  Procedure Laterality Date  . TYMPANOSTOMY TUBE PLACEMENT Bilateral     Medical History: Past Medical History:  Diagnosis Date  . Hyperlipidemia     Family History: Family History  Problem Relation Age of Onset  . Hyperlipidemia Father   . Heart disease Father       Review of Systems  Constitutional: Negative for activity change, chills, fatigue and unexpected weight change.  HENT: Negative for congestion, postnasal drip, rhinorrhea, sneezing and sore throat.   Respiratory: Negative for cough, chest tightness and shortness of breath.   Cardiovascular: Negative for chest pain and palpitations.  Gastrointestinal: Negative for abdominal pain, constipation, diarrhea, nausea and vomiting.  Endocrine: Negative for cold intolerance, heat  intolerance, polydipsia and polyuria.  Musculoskeletal: Negative for arthralgias, back pain, joint swelling and neck pain.  Skin: Negative for rash.  Allergic/Immunologic: Negative for environmental allergies.  Neurological: Positive for dizziness. Negative for tremors, numbness and headaches.  Hematological: Negative for adenopathy. Does not bruise/bleed easily.  Psychiatric/Behavioral: Negative for behavioral problems (Depression), sleep disturbance and suicidal ideas. The patient is not nervous/anxious.      Today's Vitals   08/09/19 1103  BP: (!) 141/82  Pulse: (!) 48  Resp: 16  Temp: 98 F (36.7 C)  SpO2: 98%  Weight: 183 lb 3.2 oz (83.1 kg)  Height: 5\' 8"  (1.727 m)   Body mass index is 27.86 kg/m.  Physical Exam Vitals signs and nursing note reviewed.  Constitutional:      General: He is not in acute distress.    Appearance: Normal appearance. He is well-developed. He is not diaphoretic.  HENT:     Head: Normocephalic and atraumatic.     Nose: Nose normal.     Mouth/Throat:     Pharynx: No oropharyngeal exudate.  Eyes:     Extraocular Movements: Extraocular movements intact.     Pupils: Pupils are equal, round, and reactive to light.  Neck:     Musculoskeletal: Normal range of motion and neck supple.     Thyroid: No thyromegaly.     Vascular: No carotid bruit or JVD.     Trachea: No tracheal deviation.  Cardiovascular:     Rate and Rhythm: Normal rate and regular  rhythm.     Pulses: Normal pulses.     Heart sounds: Normal heart sounds. No murmur. No friction rub. No gallop.   Pulmonary:     Effort: Pulmonary effort is normal. No respiratory distress.     Breath sounds: Normal breath sounds. No wheezing or rales.  Chest:     Chest wall: No tenderness.  Abdominal:     General: Bowel sounds are normal.     Palpations: Abdomen is soft.     Tenderness: There is no abdominal tenderness.  Musculoskeletal: Normal range of motion.  Lymphadenopathy:     Cervical:  No cervical adenopathy.  Skin:    General: Skin is warm and dry.  Neurological:     Mental Status: He is alert and oriented to person, place, and time.     Cranial Nerves: No cranial nerve deficit.  Psychiatric:        Mood and Affect: Mood normal.        Behavior: Behavior normal.        Thought Content: Thought content normal.        Judgment: Judgment normal.    LABS: Recent Results (from the past 2160 hour(s))  UA/M w/rflx Culture, Routine     Status: None   Collection Time: 08/09/19 12:00 AM   Specimen: Urine   URINE  Result Value Ref Range   Specific Gravity, UA 1.016 1.005 - 1.030   pH, UA 5.5 5.0 - 7.5   Color, UA Yellow Yellow   Appearance Ur Clear Clear   Leukocytes,UA Negative Negative   Protein,UA Negative Negative/Trace   Glucose, UA Negative Negative   Ketones, UA Negative Negative   RBC, UA Negative Negative   Bilirubin, UA Negative Negative   Urobilinogen, Ur 0.2 0.2 - 1.0 mg/dL   Nitrite, UA Negative Negative   Microscopic Examination Comment     Comment: Microscopic follows if indicated.   Microscopic Examination See below:     Comment: Microscopic was indicated and was performed.   Urinalysis Reflex Comment     Comment: This specimen will not reflex to a Urine Culture.  Microscopic Examination     Status: None   Collection Time: 08/09/19 12:00 AM   URINE  Result Value Ref Range   WBC, UA None seen 0 - 5 /hpf   RBC None seen 0 - 2 /hpf   Epithelial Cells (non renal) None seen 0 - 10 /hpf   Casts None seen None seen /lpf   Mucus, UA Present Not Estab.   Bacteria, UA None seen None seen/Few  Comprehensive metabolic panel     Status: Abnormal   Collection Time: 08/14/19  9:01 AM  Result Value Ref Range   Glucose 98 65 - 99 mg/dL   BUN 19 6 - 24 mg/dL   Creatinine, Ser 1.15 0.76 - 1.27 mg/dL   GFR calc non Af Amer 74 >59 mL/min/1.73   GFR calc Af Amer 85 >59 mL/min/1.73   BUN/Creatinine Ratio 17 9 - 20   Sodium 142 134 - 144 mmol/L   Potassium  5.4 (H) 3.5 - 5.2 mmol/L   Chloride 103 96 - 106 mmol/L   CO2 23 20 - 29 mmol/L   Calcium 9.7 8.7 - 10.2 mg/dL   Total Protein 7.1 6.0 - 8.5 g/dL   Albumin 4.5 4.0 - 5.0 g/dL   Globulin, Total 2.6 1.5 - 4.5 g/dL   Albumin/Globulin Ratio 1.7 1.2 - 2.2   Bilirubin Total 0.7 0.0 - 1.2 mg/dL  Alkaline Phosphatase 62 39 - 117 IU/L   AST 41 (H) 0 - 40 IU/L   ALT 37 0 - 44 IU/L  CBC     Status: None   Collection Time: 08/14/19  9:01 AM  Result Value Ref Range   WBC 3.7 3.4 - 10.8 x10E3/uL   RBC 4.63 4.14 - 5.80 x10E6/uL   Hemoglobin 15.3 13.0 - 17.7 g/dL   Hematocrit 44.4 37.5 - 51.0 %   MCV 96 79 - 97 fL   MCH 33.0 26.6 - 33.0 pg   MCHC 34.5 31.5 - 35.7 g/dL   RDW 12.3 11.6 - 15.4 %   Platelets 192 150 - 450 x10E3/uL  Lipid Panel w/o Chol/HDL Ratio     Status: Abnormal   Collection Time: 08/14/19  9:01 AM  Result Value Ref Range   Cholesterol, Total 195 100 - 199 mg/dL   Triglycerides 81 0 - 149 mg/dL   HDL 68 >39 mg/dL   VLDL Cholesterol Cal 15 5 - 40 mg/dL   LDL Chol Calc (NIH) 112 (H) 0 - 99 mg/dL  T4, free     Status: None   Collection Time: 08/14/19  9:01 AM  Result Value Ref Range   Free T4 1.09 0.82 - 1.77 ng/dL  TSH     Status: None   Collection Time: 08/14/19  9:01 AM  Result Value Ref Range   TSH 1.180 0.450 - 4.500 uIU/mL  PSA     Status: None   Collection Time: 08/14/19  9:01 AM  Result Value Ref Range   Prostate Specific Ag, Serum 0.8 0.0 - 4.0 ng/mL    Comment: Roche ECLIA methodology. According to the American Urological Association, Serum PSA should decrease and remain at undetectable levels after radical prostatectomy. The AUA defines biochemical recurrence as an initial PSA value 0.2 ng/mL or greater followed by a subsequent confirmatory PSA value 0.2 ng/mL or greater. Values obtained with different assay methods or kits cannot be used interchangeably. Results cannot be interpreted as absolute evidence of the presence or absence of malignant disease.      Assessment/Plan: 1. Encounter for general adult medical examination with abnormal findings Annual health maintenance exam today. Lab slip given to patient for routine, fasting labs.   2. Near syncope Echocardiogram ordered for further evaluation.  - ECHOCARDIOGRAM COMPLETE; Future  3. Orthostatic hypotension Echocardiogram ordered for further evaluation.  Encouraged him to increase intake of water.  - ECHOCARDIOGRAM COMPLETE; Future  4. Dysuria - UA/M w/rflx Culture, Routine  General Counseling: barin shirah understanding of the findings of todays visit and agrees with plan of treatment. I have discussed any further diagnostic evaluation that may be needed or ordered today. We also reviewed his medications today. he has been encouraged to call the office with any questions or concerns that should arise related to todays visit.    Counseling:  This patient was seen by Leretha Pol FNP Collaboration with Dr Lavera Guise as a part of collaborative care agreement  Orders Placed This Encounter  Procedures  . Microscopic Examination  . UA/M w/rflx Culture, Routine  . ECHOCARDIOGRAM COMPLETE    Time spent: [place], MD  Internal Medicine

## 2019-08-10 LAB — UA/M W/RFLX CULTURE, ROUTINE
Bilirubin, UA: NEGATIVE
Glucose, UA: NEGATIVE
Ketones, UA: NEGATIVE
Leukocytes,UA: NEGATIVE
Nitrite, UA: NEGATIVE
Protein,UA: NEGATIVE
RBC, UA: NEGATIVE
Specific Gravity, UA: 1.016 (ref 1.005–1.030)
Urobilinogen, Ur: 0.2 mg/dL (ref 0.2–1.0)
pH, UA: 5.5 (ref 5.0–7.5)

## 2019-08-10 LAB — MICROSCOPIC EXAMINATION
Bacteria, UA: NONE SEEN
Casts: NONE SEEN /lpf
Epithelial Cells (non renal): NONE SEEN /hpf (ref 0–10)
RBC, Urine: NONE SEEN /hpf (ref 0–2)
WBC, UA: NONE SEEN /hpf (ref 0–5)

## 2019-08-14 ENCOUNTER — Other Ambulatory Visit: Payer: Self-pay | Admitting: Nurse Practitioner

## 2019-08-14 DIAGNOSIS — Z0001 Encounter for general adult medical examination with abnormal findings: Secondary | ICD-10-CM | POA: Diagnosis not present

## 2019-08-14 DIAGNOSIS — Z125 Encounter for screening for malignant neoplasm of prostate: Secondary | ICD-10-CM | POA: Diagnosis not present

## 2019-08-15 LAB — COMPREHENSIVE METABOLIC PANEL
ALT: 37 IU/L (ref 0–44)
AST: 41 IU/L — ABNORMAL HIGH (ref 0–40)
Albumin/Globulin Ratio: 1.7 (ref 1.2–2.2)
Albumin: 4.5 g/dL (ref 4.0–5.0)
Alkaline Phosphatase: 62 IU/L (ref 39–117)
BUN/Creatinine Ratio: 17 (ref 9–20)
BUN: 19 mg/dL (ref 6–24)
Bilirubin Total: 0.7 mg/dL (ref 0.0–1.2)
CO2: 23 mmol/L (ref 20–29)
Calcium: 9.7 mg/dL (ref 8.7–10.2)
Chloride: 103 mmol/L (ref 96–106)
Creatinine, Ser: 1.15 mg/dL (ref 0.76–1.27)
GFR calc Af Amer: 85 mL/min/{1.73_m2} (ref >59)
GFR calc non Af Amer: 74 mL/min/{1.73_m2} (ref >59)
Globulin, Total: 2.6 g/dL (ref 1.5–4.5)
Glucose: 98 mg/dL (ref 65–99)
Potassium: 5.4 mmol/L — ABNORMAL HIGH (ref 3.5–5.2)
Sodium: 142 mmol/L (ref 134–144)
Total Protein: 7.1 g/dL (ref 6.0–8.5)

## 2019-08-15 LAB — CBC
Hematocrit: 44.4 % (ref 37.5–51.0)
Hemoglobin: 15.3 g/dL (ref 13.0–17.7)
MCH: 33 pg (ref 26.6–33.0)
MCHC: 34.5 g/dL (ref 31.5–35.7)
MCV: 96 fL (ref 79–97)
Platelets: 192 10*3/uL (ref 150–450)
RBC: 4.63 x10E6/uL (ref 4.14–5.80)
RDW: 12.3 % (ref 11.6–15.4)
WBC: 3.7 10*3/uL (ref 3.4–10.8)

## 2019-08-15 LAB — LIPID PANEL W/O CHOL/HDL RATIO
Cholesterol, Total: 195 mg/dL (ref 100–199)
HDL: 68 mg/dL (ref >39)
LDL Chol Calc (NIH): 112 mg/dL — ABNORMAL HIGH (ref 0–99)
Triglycerides: 81 mg/dL (ref 0–149)
VLDL Cholesterol Cal: 15 mg/dL (ref 5–40)

## 2019-08-15 LAB — PSA: Prostate Specific Ag, Serum: 0.8 ng/mL (ref 0.0–4.0)

## 2019-08-15 LAB — T4, FREE: Free T4: 1.09 ng/dL (ref 0.82–1.77)

## 2019-08-15 LAB — TSH: TSH: 1.18 u[IU]/mL (ref 0.450–4.500)

## 2019-08-18 DIAGNOSIS — R55 Syncope and collapse: Secondary | ICD-10-CM | POA: Insufficient documentation

## 2019-08-18 DIAGNOSIS — R300 Dysuria: Secondary | ICD-10-CM | POA: Insufficient documentation

## 2019-08-18 DIAGNOSIS — I951 Orthostatic hypotension: Secondary | ICD-10-CM | POA: Insufficient documentation

## 2019-08-21 NOTE — Progress Notes (Signed)
Will do. I see him back 12/21

## 2019-08-28 ENCOUNTER — Telehealth: Payer: Self-pay

## 2019-08-28 NOTE — Telephone Encounter (Signed)
Confirmed appointment with patient. klh °

## 2019-08-30 ENCOUNTER — Ambulatory Visit: Payer: BC Managed Care – PPO

## 2019-08-30 ENCOUNTER — Other Ambulatory Visit: Payer: Self-pay

## 2019-08-30 DIAGNOSIS — I951 Orthostatic hypotension: Secondary | ICD-10-CM | POA: Diagnosis not present

## 2019-08-30 DIAGNOSIS — R55 Syncope and collapse: Secondary | ICD-10-CM

## 2019-09-02 NOTE — Progress Notes (Signed)
Looks good. Review with patient 09/19/2019

## 2019-09-05 ENCOUNTER — Telehealth: Payer: Self-pay

## 2019-09-05 NOTE — Telephone Encounter (Signed)
LMOM FOR PATIENT TO CONFIRM AND SCREEN FOR 09-09-19 OV.

## 2019-09-09 ENCOUNTER — Ambulatory Visit: Payer: BC Managed Care – PPO | Admitting: Nurse Practitioner

## 2019-09-09 ENCOUNTER — Other Ambulatory Visit: Payer: Self-pay

## 2019-09-09 ENCOUNTER — Encounter: Payer: Self-pay | Admitting: Nurse Practitioner

## 2019-09-09 DIAGNOSIS — E875 Hyperkalemia: Secondary | ICD-10-CM | POA: Diagnosis not present

## 2019-09-09 DIAGNOSIS — I951 Orthostatic hypotension: Secondary | ICD-10-CM

## 2019-09-09 DIAGNOSIS — E785 Hyperlipidemia, unspecified: Secondary | ICD-10-CM | POA: Diagnosis not present

## 2019-09-09 NOTE — Progress Notes (Signed)
[facility] [place], [place] [address]  Internal MEDICINE  Telephone Visit  Patient Name: Joshua Cooke  [id number]  [id number]  Date of Service: 09/09/2019  I connected with the patient at 12:54pm by webcam and verified the patients identity using two identifiers.   I discussed the limitations, risks, security and privacy concerns of performing an evaluation and management service by webcam and the availability of in person appointments. I also discussed with the patient that there may be a patient responsible charge related to the service.  The patient expressed understanding and agrees to proceed.    Chief Complaint  Patient presents with  . Follow-up    ECHO    The patient has been contacted via webcam for follow up visit due to concerns for spread of novel coronavirus. He presents fr a follow up visit. He continues to have intermittent episodes of dizziness. Episodes are less frequent and they are lasting for shorter periods of time. He states that episodes occur when he stands up after resting. Last for a few seconds then resolve on their own. He denies chest pain, chest pressure, or shortness of breath. Has had not problems with balance. He is active runner. He does, typically, have low heart rate at 48. This is normal heart rate for him as he is very active.  He had echocardiogram. He has normal LVEF and normal diastolic function. There was trace mitral, tricuspid, and pulmonary regurgitation. Reviewed labs. Has mildlly elevated LDL at 112 with normal total cholesterol at 195. His potassium was 5.4. discussed increasing hydration status and retesting BMP. He wishes to monitor his symptoms for now and will have labs rechecked if they worsen or become more frequent.        Current Medication: No outpatient encounter medications on file as of 09/09/2019.   No facility-administered encounter medications on file as of 09/09/2019.    Surgical  History: Past Surgical History:  Procedure Laterality Date  . TYMPANOSTOMY TUBE PLACEMENT Bilateral     Medical History: Past Medical History:  Diagnosis Date  . Hyperlipidemia     Family History: Family History  Problem Relation Age of Onset  . Hyperlipidemia Father   . Heart disease Father     Social History   Socioeconomic History  . Marital status: Married    Spouse name: Not on file  . Number of children: Not on file  . Years of education: Not on file  . Highest education level: Not on file  Occupational History  . Not on file  Tobacco Use  . Smoking status: Former Research scientist (life sciences)  . Smokeless tobacco: Never Used  . Tobacco comment: stopped about 7-8 years ago reported on 08/09/2019  Substance and Sexual Activity  . Alcohol use: Yes    Alcohol/week: 3.0 standard drinks    Types: 3 Cans of beer per week  . Drug use: Never  . Sexual activity: Not on file  Other Topics Concern  . Not on file  Social History Narrative  . Not on file   Social Determinants of Health   Financial Resource Strain:   . Difficulty of Paying Living Expenses: Not on file  Food Insecurity:   . Worried About Charity fundraiser in the Last Year: Not on file  . Ran Out of Food in the Last Year: Not on file  Transportation Needs:   . Lack of Transportation (Medical): Not on file  . Lack of Transportation (Non-Medical): Not on file  Physical  Activity:   . Days of Exercise per Week: Not on file  . Minutes of Exercise per Session: Not on file  Stress:   . Feeling of Stress : Not on file  Social Connections:   . Frequency of Communication with Friends and Family: Not on file  . Frequency of Social Gatherings with Friends and Family: Not on file  . Attends Religious Services: Not on file  . Active Member of Clubs or Organizations: Not on file  . Attends Archivist Meetings: Not on file  . Marital Status: Not on file  Intimate Partner Violence:   . Fear of Current or Ex-Partner: Not  on file  . Emotionally Abused: Not on file  . Physically Abused: Not on file  . Sexually Abused: Not on file      Review of Systems  Constitutional: Negative for activity change, chills, fatigue and unexpected weight change.  HENT: Negative for congestion, postnasal drip, rhinorrhea, sneezing and sore throat.   Respiratory: Negative for cough, chest tightness and shortness of breath.   Cardiovascular: Negative for chest pain and palpitations.  Gastrointestinal: Negative for abdominal pain, constipation, diarrhea, nausea and vomiting.  Endocrine: Negative for cold intolerance, heat intolerance, polydipsia and polyuria.  Musculoskeletal: Negative for arthralgias, back pain, joint swelling and neck pain.  Skin: Negative for rash.  Allergic/Immunologic: Negative for environmental allergies.  Neurological: Positive for dizziness. Negative for tremors, numbness and headaches.  Hematological: Negative for adenopathy. Does not bruise/bleed easily.  Psychiatric/Behavioral: Negative for behavioral problems (Depression), sleep disturbance and suicidal ideas. The patient is not nervous/anxious.     Vital Signs: There were no vitals taken for this visit.   Observation/Objective:  The patient is alert and oriented. He is pleasant and answering all questions appropriately. Breathing is non-labored. He is in no acute distress.    Assessment/Plan: 1. Orthostatic hypotension Symptoms becoming les frequent and less severe. Patient to increase hydration and monitor symptoms. Reviewed echocardiogram which was good. He has normal LVEF, normal diastolic function, and trace valvular regurgitation.   2. Hyperlipidemia, unspecified hyperlipidemia type Reviewed labs. LDL 112 and total cholesterol 195. Diet and lifestyle changes discussed. Recheck next year.   3. Hyperkalemia Potassium level 5.4. patient will increase hydration. Will have labs redrawn if he develops new or worsening symptoms.   General  Counseling: jermell greenwell understanding of the findings of today's phone visit and agrees with plan of treatment. I have discussed any further diagnostic evaluation that may be needed or ordered today. We also reviewed his medications today. he has been encouraged to call the office with any questions or concerns that should arise related to todays visit.  This patient was seen by Leretha Pol FNP Collaboration with Dr Lavera Guise as a part of collaborative care agreement   Time spent: 38 Minutes    Dr Lavera Guise Internal medicine

## 2020-09-08 ENCOUNTER — Encounter: Payer: Self-pay | Admitting: Nurse Practitioner

## 2020-09-08 ENCOUNTER — Ambulatory Visit (INDEPENDENT_AMBULATORY_CARE_PROVIDER_SITE_OTHER): Payer: BC Managed Care – PPO | Admitting: Nurse Practitioner

## 2020-09-08 ENCOUNTER — Other Ambulatory Visit: Payer: Self-pay

## 2020-09-08 VITALS — BP 131/85 | HR 71 | Temp 97.8°F | Resp 16 | Ht 68.0 in | Wt 192.4 lb

## 2020-09-08 DIAGNOSIS — Z0001 Encounter for general adult medical examination with abnormal findings: Secondary | ICD-10-CM | POA: Diagnosis not present

## 2020-09-08 DIAGNOSIS — Z1211 Encounter for screening for malignant neoplasm of colon: Secondary | ICD-10-CM | POA: Diagnosis not present

## 2020-09-08 DIAGNOSIS — B351 Tinea unguium: Secondary | ICD-10-CM | POA: Diagnosis not present

## 2020-09-08 DIAGNOSIS — R300 Dysuria: Secondary | ICD-10-CM

## 2020-09-08 MED ORDER — TERBINAFINE HCL 250 MG PO TABS: 250.0000 mg | ORAL_TABLET | Freq: Every day | ORAL | 1 refills | Status: DC

## 2020-09-08 NOTE — Progress Notes (Signed)
[facility] [address] [place], [place] [address]  Internal MEDICINE  Office Visit Note  Patient Name: Joshua Cooke  [id number]  [id number]  Date of Service: 09/08/2020   Pt is here for routine health maintenance examination  Chief Complaint  Patient presents with  . Hyperlipidemia  . controlled substance form    Reviewed with PT     The patient is here for health maintenance exam.  -fungal infection present, effecting both great toes. Worse on left side than right. Has been present for a few years. Has tried several OTC medications and a few different prescription topical medications which have been ineffective.  -no other concerns or complaints today -due to have routine, fasting labs.  -due to have colon cancer screening.    Current Medication: Outpatient Encounter Medications as of 09/08/2020  Medication Sig  . terbinafine (LAMISIL) 250 MG tablet Take 1 tablet (250 mg total) by mouth daily.   No facility-administered encounter medications on file as of 09/08/2020.    Surgical History: Past Surgical History:  Procedure Laterality Date  . TYMPANOSTOMY TUBE PLACEMENT Bilateral     Medical History: Past Medical History:  Diagnosis Date  . Hyperlipidemia     Family History: Family History  Problem Relation Age of Onset  . Hyperlipidemia Father   . Heart disease Father       Review of Systems  Constitutional: Negative for activity change, chills, fatigue and unexpected weight change.  HENT: Negative for congestion, postnasal drip, rhinorrhea, sneezing and sore throat.   Respiratory: Negative for cough, chest tightness and shortness of breath.   Cardiovascular: Negative for chest pain and palpitations.  Gastrointestinal: Negative for abdominal pain, constipation, diarrhea, nausea and vomiting.  Endocrine: Negative for cold intolerance, heat intolerance, polydipsia and polyuria.  Genitourinary: Negative for dysuria, frequency and  urgency.  Musculoskeletal: Negative for arthralgias, back pain, joint swelling and neck pain.  Skin: Negative for rash.       Fungal infection effecting the great toenails of both feet.  Neurological: Negative.  Negative for tremors and numbness.  Hematological: Negative for adenopathy. Does not bruise/bleed easily.  Psychiatric/Behavioral: Negative for behavioral problems (Depression), sleep disturbance and suicidal ideas. The patient is not nervous/anxious.      Today's Vitals   09/08/20 0932  BP: 131/85  Pulse: 71  Resp: 16  Temp: 97.8 F (36.6 C)  SpO2: 98%  Weight: 192 lb 6.4 oz (87.3 kg)  Height: 5\' 8"  (1.727 m)   Body mass index is 29.25 kg/m.  Physical Exam Vitals and nursing note reviewed.  Constitutional:      General: He is not in acute distress.    Appearance: Normal appearance. He is well-developed and well-nourished. He is not diaphoretic.  HENT:     Head: Normocephalic and atraumatic.     Mouth/Throat:     Mouth: Oropharynx is clear and moist.     Pharynx: No oropharyngeal exudate.  Eyes:     Extraocular Movements: EOM normal.     Pupils: Pupils are equal, round, and reactive to light.  Neck:     Thyroid: No thyromegaly.     Vascular: No JVD.     Trachea: No tracheal deviation.  Cardiovascular:     Rate and Rhythm: Normal rate and regular rhythm.     Pulses: Normal pulses.     Heart sounds: Normal heart sounds. No murmur heard. No friction rub. No gallop.   Pulmonary:     Effort: Pulmonary effort is normal.  No respiratory distress.     Breath sounds: Normal breath sounds. No wheezing or rales.  Chest:     Chest wall: No tenderness.  Abdominal:     General: Bowel sounds are normal.     Palpations: Abdomen is soft.     Tenderness: There is no abdominal tenderness.  Musculoskeletal:        General: Normal range of motion.     Cervical back: Normal range of motion and neck supple.  Lymphadenopathy:     Cervical: No cervical adenopathy.  Skin:     General: Skin is warm and dry.     Capillary Refill: Capillary refill takes less than 2 seconds.  Neurological:     General: No focal deficit present.     Mental Status: He is alert and oriented to person, place, and time.     Cranial Nerves: No cranial nerve deficit.  Psychiatric:        Mood and Affect: Mood and affect and mood normal.        Behavior: Behavior normal.        Thought Content: Thought content normal.        Judgment: Judgment normal.     Assessment/Plan: 1. Encounter for general adult medical examination with abnormal findings Annual health maintenance exam today. Order slip given to have routine, fasting labs drawn.   2. Onychomycosis of great toe Add lamisil 250mg  tablets daily for next several months. Will get baseline liver function test with routine labs.  - terbinafine (LAMISIL) 250 MG tablet; Take 1 tablet (250 mg total) by mouth daily.  Dispense: 90 tablet; Refill: 1  3. Screening for colon cancer Order for Cologuard to be sent   4. Dysuria - UA/M w/rflx Culture, Routine  General Counseling: jp eastham understanding of the findings of todays visit and agrees with plan of treatment. I have discussed any further diagnostic evaluation that may be needed or ordered today. We also reviewed his medications today. he has been encouraged to call the office with any questions or concerns that should arise related to todays visit.    Counseling:  This patient was seen by Leretha Pol FNP Collaboration with Dr Lavera Guise as a part of collaborative care agreement  Orders Placed This Encounter  Procedures  . UA/M w/rflx Culture, Routine    Meds ordered this encounter  Medications  . terbinafine (LAMISIL) 250 MG tablet    Sig: Take 1 tablet (250 mg total) by mouth daily.    Dispense:  90 tablet    Refill:  1    Order Specific Question:   Supervising Provider    Answer:   Lavera Guise [[id number]]    Total time spent: 30 Minutes  Time spent  includes review of chart, medications, test results, and follow up plan with the patient.     Lavera Guise, MD  Internal Medicine

## 2020-09-09 LAB — UA/M W/RFLX CULTURE, ROUTINE
Bilirubin, UA: NEGATIVE
Glucose, UA: NEGATIVE
Ketones, UA: NEGATIVE
Leukocytes,UA: NEGATIVE
Nitrite, UA: NEGATIVE
Protein,UA: NEGATIVE
RBC, UA: NEGATIVE
Specific Gravity, UA: 1.015 (ref 1.005–1.030)
Urobilinogen, Ur: 0.2 mg/dL (ref 0.2–1.0)
pH, UA: 6 (ref 5.0–7.5)

## 2020-09-09 LAB — MICROSCOPIC EXAMINATION
Bacteria, UA: NONE SEEN
Casts: NONE SEEN /lpf
Epithelial Cells (non renal): NONE SEEN /hpf (ref 0–10)
RBC, Urine: NONE SEEN /hpf (ref 0–2)
WBC, UA: NONE SEEN /hpf (ref 0–5)

## 2020-09-17 ENCOUNTER — Other Ambulatory Visit: Payer: Self-pay | Admitting: Nurse Practitioner

## 2020-09-17 DIAGNOSIS — Z0001 Encounter for general adult medical examination with abnormal findings: Secondary | ICD-10-CM | POA: Diagnosis not present

## 2020-09-17 DIAGNOSIS — Z125 Encounter for screening for malignant neoplasm of prostate: Secondary | ICD-10-CM | POA: Diagnosis not present

## 2020-09-17 DIAGNOSIS — B351 Tinea unguium: Secondary | ICD-10-CM | POA: Diagnosis not present

## 2020-09-18 LAB — COMPREHENSIVE METABOLIC PANEL
ALT: 88 IU/L — ABNORMAL HIGH (ref 0–44)
AST: 64 IU/L — ABNORMAL HIGH (ref 0–40)
Albumin/Globulin Ratio: 1.4 (ref 1.2–2.2)
Albumin: 4.3 g/dL (ref 3.8–4.9)
Alkaline Phosphatase: 59 IU/L (ref 44–121)
BUN/Creatinine Ratio: 12 (ref 9–20)
BUN: 15 mg/dL (ref 6–24)
Bilirubin Total: 0.9 mg/dL (ref 0.0–1.2)
CO2: 26 mmol/L (ref 20–29)
Calcium: 9.4 mg/dL (ref 8.7–10.2)
Chloride: 98 mmol/L (ref 96–106)
Creatinine, Ser: 1.22 mg/dL (ref 0.76–1.27)
GFR calc Af Amer: 79 mL/min/{1.73_m2} (ref >59)
GFR calc non Af Amer: 68 mL/min/{1.73_m2} (ref >59)
Globulin, Total: 3 g/dL (ref 1.5–4.5)
Glucose: 104 mg/dL — ABNORMAL HIGH (ref 65–99)
Potassium: 5 mmol/L (ref 3.5–5.2)
Sodium: 137 mmol/L (ref 134–144)
Total Protein: 7.3 g/dL (ref 6.0–8.5)

## 2020-09-18 LAB — TSH: TSH: 1.48 u[IU]/mL (ref 0.450–4.500)

## 2020-09-18 LAB — LIPID PANEL WITH LDL/HDL RATIO
Cholesterol, Total: 200 mg/dL — ABNORMAL HIGH (ref 100–199)
HDL: 68 mg/dL (ref >39)
LDL Chol Calc (NIH): 120 mg/dL — ABNORMAL HIGH (ref 0–99)
LDL/HDL Ratio: 1.8 ratio (ref 0.0–3.6)
Triglycerides: 66 mg/dL (ref 0–149)
VLDL Cholesterol Cal: 12 mg/dL (ref 5–40)

## 2020-09-18 LAB — CBC
Hematocrit: 44.7 % (ref 37.5–51.0)
Hemoglobin: 15.7 g/dL (ref 13.0–17.7)
MCH: 32.6 pg (ref 26.6–33.0)
MCHC: 35.1 g/dL (ref 31.5–35.7)
MCV: 93 fL (ref 79–97)
Platelets: 189 10*3/uL (ref 150–450)
RBC: 4.81 x10E6/uL (ref 4.14–5.80)
RDW: 11.8 % (ref 11.6–15.4)
WBC: 3.4 10*3/uL (ref 3.4–10.8)

## 2020-09-18 LAB — T4, FREE: Free T4: 1.23 ng/dL (ref 0.82–1.77)

## 2020-09-18 LAB — PSA: Prostate Specific Ag, Serum: 0.8 ng/mL (ref 0.0–4.0)

## 2020-09-20 NOTE — Progress Notes (Signed)
Liver functions elevation. Needs to have further evaluation

## 2021-06-03 ENCOUNTER — Encounter: Payer: Self-pay | Admitting: Physician Assistant

## 2021-06-03 ENCOUNTER — Telehealth: Payer: BC Managed Care – PPO | Admitting: Physician Assistant

## 2021-06-03 ENCOUNTER — Other Ambulatory Visit: Payer: Self-pay

## 2021-06-03 VITALS — Ht 68.0 in | Wt 190.0 lb

## 2021-06-03 DIAGNOSIS — K219 Gastro-esophageal reflux disease without esophagitis: Secondary | ICD-10-CM | POA: Diagnosis not present

## 2021-06-03 DIAGNOSIS — J301 Allergic rhinitis due to pollen: Secondary | ICD-10-CM

## 2021-06-03 NOTE — Progress Notes (Signed)
[facility] [place], [place] [address]  Internal MEDICINE  Telephone Visit  Patient Name: Joshua Cooke  [id number]  [id number]  Date of Service: 06/03/2021  I connected with the patient at 3:15 by telephone and verified the patients identity using two identifiers.   I discussed the limitations, risks, security and privacy concerns of performing an evaluation and management service by telephone and the availability of in person appointments. I also discussed with the patient that there may be a patient responsible charge related to the service.  The patient expressed understanding and agrees to proceed.    Chief Complaint  Patient presents with   Telephone Assessment    [phone]    Telephone Screen   Sinusitis    Going on few days     HPI Pt is here for a virtual sick visit. -he has been experiencing sinus congestion that started about 6 months ago. Felt tired and sinus pressure for about 4-6 weeks and then started to subside with OTC antihistamine and then will come and go since then. -Recently throughout the day feels like some drainage in throat that causes him to cough for a bit, then subsides. Denies any sinus congestion other that the throat drainage currently. -Has not been taking antihistamine recently because didn't think it was helping the drainage recently but only did it for a few days and discussed restarting daily. -Not really coughing anything up and cough is not persistent throughout the day just comes and goes. Does mention an instance where this flared after cutting the grass and seemed to trigger drainage. -Denies any difficulty swallowing, wheezing or SOB. -Has started having some acid reflux and has had some nausea as well. States the burning reflux happens later in the day but the nausea is usually early in the morning at the same time as the drainage. -Discussed trying to treat the acid reflux to see if this may contribute to cough  and will try omeprazole OTC first before trying prescription.  Current Medication: Outpatient Encounter Medications as of 06/03/2021  Medication Sig   terbinafine (LAMISIL) 250 MG tablet Take 1 tablet (250 mg total) by mouth daily.   No facility-administered encounter medications on file as of 06/03/2021.    Surgical History: Past Surgical History:  Procedure Laterality Date   TYMPANOSTOMY TUBE PLACEMENT Bilateral     Medical History: Past Medical History:  Diagnosis Date   Hyperlipidemia     Family History: Family History  Problem Relation Age of Onset   Hyperlipidemia Father    Heart disease Father     Social History   Socioeconomic History   Marital status: Married    Spouse name: Not on file   Number of children: Not on file   Years of education: Not on file   Highest education level: Not on file  Occupational History   Not on file  Tobacco Use   Smoking status: Former   Smokeless tobacco: Never   Tobacco comments:    stopped about 7-8 years ago reported on 08/09/2019  Vaping Use   Vaping Use: Never used  Substance and Sexual Activity   Alcohol use: Yes    Alcohol/week: 3.0 standard drinks    Types: 3 Cans of beer per week   Drug use: Never   Sexual activity: Not on file  Other Topics Concern   Not on file  Social History Narrative   Not on file   Social Determinants of Health   Financial Resource  Strain: Not on file  Food Insecurity: Not on file  Transportation Needs: Not on file  Physical Activity: Not on file  Stress: Not on file  Social Connections: Not on file  Intimate Partner Violence: Not on file      Review of Systems  Constitutional:  Negative for fatigue and fever.  HENT:  Positive for congestion and postnasal drip. Negative for mouth sores and trouble swallowing.   Respiratory:  Positive for cough. Negative for shortness of breath and wheezing.   Cardiovascular:  Negative for chest pain.  Gastrointestinal:  Positive for nausea.        Reflux  Genitourinary:  Negative for flank pain.  Allergic/Immunologic: Positive for environmental allergies.  Psychiatric/Behavioral: Negative.     Vital Signs: Ht '5\' 8"'$  (1.727 m)   Wt 190 lb (86.2 kg)   BMI 28.89 kg/m    Observation/Objective:  Pt is able to carry out conversation   Assessment/Plan: 1. Seasonal allergic rhinitis due to pollen Will start back on antihistamine daily and add nasal spray such as flonase. May use mucinex if acute worsening of sinus congestion.  2. Gastroesophageal reflux disease without esophagitis Will start on OTC strength omeprazole and will call office if not improving. May need higher strength or upper GI   General Counseling: halo wiltfong understanding of the findings of today's phone visit and agrees with plan of treatment. I have discussed any further diagnostic evaluation that may be needed or ordered today. We also reviewed his medications today. he has been encouraged to call the office with any questions or concerns that should arise related to todays visit.    No orders of the defined types were placed in this encounter.   No orders of the defined types were placed in this encounter.   Time spent:25 Minutes    Dr Lavera Guise Internal medicine

## 2021-07-01 ENCOUNTER — Telehealth: Payer: Self-pay

## 2021-07-01 NOTE — Telephone Encounter (Signed)
Contacted patient to schedule a follow- up as they left a message on Monday that his wife tested positive for Hepatitis-C. He advised he will call us back once he has his schedule with him.

## 2021-07-02 ENCOUNTER — Other Ambulatory Visit: Payer: Self-pay | Admitting: Physician Assistant

## 2021-07-02 DIAGNOSIS — Z0189 Encounter for other specified special examinations: Secondary | ICD-10-CM

## 2021-07-05 NOTE — Telephone Encounter (Signed)
error 

## 2021-09-09 ENCOUNTER — Other Ambulatory Visit: Payer: Self-pay

## 2021-09-09 ENCOUNTER — Ambulatory Visit (INDEPENDENT_AMBULATORY_CARE_PROVIDER_SITE_OTHER): Payer: BC Managed Care – PPO | Admitting: Nurse Practitioner

## 2021-09-09 ENCOUNTER — Encounter: Payer: Self-pay | Admitting: Nurse Practitioner

## 2021-09-09 VITALS — BP 136/74 | HR 61 | Temp 98.4°F | Resp 16 | Ht 68.0 in | Wt 194.6 lb

## 2021-09-09 DIAGNOSIS — R300 Dysuria: Secondary | ICD-10-CM

## 2021-09-09 DIAGNOSIS — E782 Mixed hyperlipidemia: Secondary | ICD-10-CM

## 2021-09-09 DIAGNOSIS — Z0001 Encounter for general adult medical examination with abnormal findings: Secondary | ICD-10-CM | POA: Diagnosis not present

## 2021-09-09 DIAGNOSIS — Z9189 Other specified personal risk factors, not elsewhere classified: Secondary | ICD-10-CM

## 2021-09-09 DIAGNOSIS — E559 Vitamin D deficiency, unspecified: Secondary | ICD-10-CM

## 2021-09-09 DIAGNOSIS — Z1159 Encounter for screening for other viral diseases: Secondary | ICD-10-CM | POA: Diagnosis not present

## 2021-09-09 NOTE — Progress Notes (Signed)
[facility] [place], [place] [address]  Internal MEDICINE  Office Visit Note  Patient Name: Joshua Cooke  [id number]  [id number]  Date of Service: 09/09/2021  Chief Complaint  Patient presents with   Annual Exam    Wants to be tested for hepC   Hyperlipidemia    HPI Joshua Cooke presents for an annual well visit and physical exam. He has no significant medical or surgical history.  He is not on any medications.  He does want to be tested for hepatitis C.  Is vital signs and blood pressure are within normal limits.  His BMI is overweight borderline obese.  He denies any pain today and has no specific concerns or questions.  He typically comes to the clinic yearly unless he has other concerns.  He is a former smoker and reports that he quit approximately 9 to 10 years ago. He is due for PSA screening.  His Cologuard test was negative in December 2021.     Current Medication: Outpatient Encounter Medications as of 09/09/2021  Medication Sig   [DISCONTINUED] terbinafine (LAMISIL) 250 MG tablet Take 1 tablet (250 mg total) by mouth daily. (Patient not taking: Reported on 09/09/2021)   No facility-administered encounter medications on file as of 09/09/2021.    Surgical History: Past Surgical History:  Procedure Laterality Date   TYMPANOSTOMY TUBE PLACEMENT Bilateral     Medical History: Past Medical History:  Diagnosis Date   Hyperlipidemia     Family History: Family History  Problem Relation Age of Onset   Hyperlipidemia Father    Heart disease Father     Social History   Socioeconomic History   Marital status: Married    Spouse name: Not on file   Number of children: Not on file   Years of education: Not on file   Highest education level: Not on file  Occupational History   Not on file  Tobacco Use   Smoking status: Former   Smokeless tobacco: Never   Tobacco comments:    stopped about 7-8 years ago reported on 08/09/2019  Vaping  Use   Vaping Use: Never used  Substance and Sexual Activity   Alcohol use: Yes    Alcohol/week: 3.0 standard drinks    Types: 3 Cans of beer per week   Drug use: Never   Sexual activity: Not on file  Other Topics Concern   Not on file  Social History Narrative   Not on file   Social Determinants of Health   Financial Resource Strain: Not on file  Food Insecurity: Not on file  Transportation Needs: Not on file  Physical Activity: Not on file  Stress: Not on file  Social Connections: Not on file  Intimate Partner Violence: Not on file      Review of Systems  Constitutional:  Negative for activity change, appetite change, chills, fatigue, fever and unexpected weight change.  HENT: Negative.  Negative for congestion, ear pain, rhinorrhea, sore throat and trouble swallowing.   Eyes: Negative.   Respiratory: Negative.  Negative for cough, chest tightness, shortness of breath and wheezing.   Cardiovascular: Negative.  Negative for chest pain.  Gastrointestinal: Negative.  Negative for abdominal pain, blood in stool, constipation, diarrhea, nausea and vomiting.  Endocrine: Negative.   Genitourinary: Negative.  Negative for difficulty urinating, dysuria, frequency, hematuria and urgency.  Musculoskeletal: Negative.  Negative for arthralgias, back pain, joint swelling, myalgias and neck pain.  Skin: Negative.  Negative for rash and wound.  Allergic/Immunologic: Negative.  Negative for immunocompromised state.  °Neurological: Negative.  Negative for dizziness, seizures, numbness and headaches.  °Hematological: Negative.   °Psychiatric/Behavioral: Negative.  Negative for behavioral problems, self-injury and suicidal ideas. The patient is not nervous/anxious.   ° °Vital Signs: °BP 136/74    Pulse 61    Temp 98.4 °F (36.9 °C)    Resp 16    Ht 5' 8" (1.727 m)    Wt 194 lb 9.6 oz (88.3 kg)    SpO2 99%    BMI 29.59 kg/m²  ° ° °Physical Exam °Vitals reviewed.  °Constitutional:   °   General: He is  awake. He is not in acute distress. °   Appearance: Normal appearance. He is well-developed and well-groomed. He is obese. He is not ill-appearing or diaphoretic.  °HENT:  °   Head: Normocephalic and atraumatic.  °   Right Ear: Tympanic membrane, ear canal and external ear normal.  °   Left Ear: Tympanic membrane, ear canal and external ear normal.  °   Nose: Nose normal. No congestion or rhinorrhea.  °   Mouth/Throat:  °   Lips: Pink.  °   Mouth: Mucous membranes are moist.  °   Pharynx: Oropharynx is clear. Uvula midline. No oropharyngeal exudate.  °Eyes:  °   General: Lids are normal. Vision grossly intact. Gaze aligned appropriately. No scleral icterus.    °   Right eye: No discharge.     °   Left eye: No discharge.  °   Extraocular Movements: Extraocular movements intact.  °   Conjunctiva/sclera: Conjunctivae normal.  °   Pupils: Pupils are equal, round, and reactive to light.  °   Funduscopic exam: °   Right eye: Red reflex present.     °   Left eye: Red reflex present. °Neck:  °   Thyroid: No thyromegaly.  °   Vascular: No JVD.  °   Trachea: Trachea and phonation normal. No tracheal deviation.  °Cardiovascular:  °   Rate and Rhythm: Normal rate and regular rhythm.  °   Pulses: Normal pulses.  °   Heart sounds: Normal heart sounds, S1 normal and S2 normal. No murmur heard. °  No friction rub. No gallop.  °Pulmonary:  °   Effort: Pulmonary effort is normal. No accessory muscle usage or respiratory distress.  °   Breath sounds: Normal breath sounds and air entry. No stridor. No wheezing or rales.  °Chest:  °   Chest wall: No tenderness.  °Abdominal:  °   General: Bowel sounds are normal. There is no distension.  °   Palpations: Abdomen is soft. There is no shifting dullness, fluid wave, mass or pulsatile mass.  °   Tenderness: There is no abdominal tenderness. There is no guarding or rebound.  °Musculoskeletal:     °   General: No tenderness or deformity. Normal range of motion.  °   Cervical back: Normal range  of motion and neck supple.  °   Right lower leg: No edema.  °   Left lower leg: No edema.  °Lymphadenopathy:  °   Cervical: No cervical adenopathy.  °Skin: °   General: Skin is warm and dry.  °   Capillary Refill: Capillary refill takes less than 2 seconds.  °   Coloration: Skin is not pale.  °   Findings: No erythema or rash.  °Neurological:  °   Mental Status: He is alert and oriented to person,   place, and time.     Cranial Nerves: No cranial nerve deficit.     Motor: No abnormal muscle tone.     Coordination: Coordination normal.     Gait: Gait normal.     Deep Tendon Reflexes: Reflexes are normal and symmetric.  Psychiatric:        Mood and Affect: Mood normal.        Behavior: Behavior normal. Behavior is cooperative.        Thought Content: Thought content normal.        Judgment: Judgment normal.       Assessment/Plan: 1. Encounter for general adult medical examination with abnormal findings Age-appropriate preventive screenings and vaccinations discussed, annual physical exam completed. Routine labs for health maintenance ordered, see below. PHM updated.  - CBC with Differential/Platelet - CMP14+EGFR - Lipid Profile - Vitamin D (25 hydroxy)  2. Mixed hyperlipidemia Routine labs ordered - CBC with Differential/Platelet - CMP14+EGFR - Lipid Profile  3. Vitamin D deficiency Routine lab ordered - Vitamin D (25 hydroxy)  4. Dysuria Routine urinalysis done - UA/M w/rflx Culture, Routine - Microscopic Examination  5. Encounter for hepatitis C virus screening test for high risk patient Lab ordered as requested - Hepatitis C Antibody      General Counseling: [name] understanding of the findings of todays visit and agrees with plan of treatment. I have discussed any further diagnostic evaluation that may be needed or ordered today. We also reviewed his medications today. he has been encouraged to call the office with any questions or concerns that should arise  related to todays visit.    Orders Placed This Encounter  Procedures   Microscopic Examination   UA/M w/rflx Culture, Routine   CBC with Differential/Platelet   CMP14+EGFR   Lipid Profile   Vitamin D (25 hydroxy)   Hepatitis C Antibody    No orders of the defined types were placed in this encounter.   Return in about 1 year (around 09/09/2022) for CPE, Arielis Leonhart PCP please print lab requisition and give to patient at check out.   Total time spent:30 Minutes Time spent includes review of chart, medications, test results, and follow up plan with the patient.   [place] Controlled Substance Database was reviewed by me.  This patient was seen by Jonetta Osgood, FNP-C in collaboration with Dr. Clayborn Bigness as a part of collaborative care agreement.  Arya Luttrull R. Valetta Fuller, MSN, FNP-C Internal medicine

## 2021-09-10 LAB — UA/M W/RFLX CULTURE, ROUTINE
Bilirubin, UA: NEGATIVE
Glucose, UA: NEGATIVE
Ketones, UA: NEGATIVE
Leukocytes,UA: NEGATIVE
Nitrite, UA: NEGATIVE
Protein,UA: NEGATIVE
RBC, UA: NEGATIVE
Specific Gravity, UA: 1.018 (ref 1.005–1.030)
Urobilinogen, Ur: 0.2 mg/dL (ref 0.2–1.0)
pH, UA: 5.5 (ref 5.0–7.5)

## 2021-09-10 LAB — MICROSCOPIC EXAMINATION
Bacteria, UA: NONE SEEN
Casts: NONE SEEN /lpf
Epithelial Cells (non renal): NONE SEEN /hpf (ref 0–10)
RBC, Urine: NONE SEEN /hpf (ref 0–2)
WBC, UA: NONE SEEN /hpf (ref 0–5)

## 2021-09-15 ENCOUNTER — Encounter: Payer: Self-pay | Admitting: Nurse Practitioner

## 2021-09-15 DIAGNOSIS — M222X9 Patellofemoral disorders, unspecified knee: Secondary | ICD-10-CM | POA: Insufficient documentation

## 2021-11-23 DIAGNOSIS — Z1159 Encounter for screening for other viral diseases: Secondary | ICD-10-CM | POA: Diagnosis not present

## 2021-11-23 DIAGNOSIS — Z0000 Encounter for general adult medical examination without abnormal findings: Secondary | ICD-10-CM | POA: Diagnosis not present

## 2021-11-23 DIAGNOSIS — E782 Mixed hyperlipidemia: Secondary | ICD-10-CM | POA: Diagnosis not present

## 2021-11-23 DIAGNOSIS — E559 Vitamin D deficiency, unspecified: Secondary | ICD-10-CM | POA: Diagnosis not present

## 2021-11-23 DIAGNOSIS — Z9189 Other specified personal risk factors, not elsewhere classified: Secondary | ICD-10-CM | POA: Diagnosis not present

## 2021-11-24 LAB — CBC WITH DIFFERENTIAL/PLATELET
Basophils Absolute: 0 10*3/uL (ref 0.0–0.2)
Basos: 1 %
EOS (ABSOLUTE): 0.2 10*3/uL (ref 0.0–0.4)
Eos: 5 %
Hematocrit: 43.9 % (ref 37.5–51.0)
Hemoglobin: 15.3 g/dL (ref 13.0–17.7)
Immature Grans (Abs): 0 10*3/uL (ref 0.0–0.1)
Immature Granulocytes: 0 %
Lymphocytes Absolute: 0.9 10*3/uL (ref 0.7–3.1)
Lymphs: 27 %
MCH: 32.4 pg (ref 26.6–33.0)
MCHC: 34.9 g/dL (ref 31.5–35.7)
MCV: 93 fL (ref 79–97)
Monocytes Absolute: 0.3 10*3/uL (ref 0.1–0.9)
Monocytes: 9 %
Neutrophils Absolute: 2 10*3/uL (ref 1.4–7.0)
Neutrophils: 58 %
Platelets: 174 10*3/uL (ref 150–450)
RBC: 4.72 x10E6/uL (ref 4.14–5.80)
RDW: 12 % (ref 11.6–15.4)
WBC: 3.5 10*3/uL (ref 3.4–10.8)

## 2021-11-24 LAB — CMP14+EGFR
ALT: 72 IU/L — ABNORMAL HIGH (ref 0–44)
AST: 57 IU/L — ABNORMAL HIGH (ref 0–40)
Albumin/Globulin Ratio: 1.6 (ref 1.2–2.2)
Albumin: 4.8 g/dL (ref 3.8–4.9)
Alkaline Phosphatase: 61 IU/L (ref 44–121)
BUN/Creatinine Ratio: 10 (ref 9–20)
BUN: 11 mg/dL (ref 6–24)
Bilirubin Total: 0.9 mg/dL (ref 0.0–1.2)
CO2: 24 mmol/L (ref 20–29)
Calcium: 9.6 mg/dL (ref 8.7–10.2)
Chloride: 97 mmol/L (ref 96–106)
Creatinine, Ser: 1.07 mg/dL (ref 0.76–1.27)
Globulin, Total: 3 g/dL (ref 1.5–4.5)
Glucose: 97 mg/dL (ref 70–99)
Potassium: 4.7 mmol/L (ref 3.5–5.2)
Sodium: 136 mmol/L (ref 134–144)
Total Protein: 7.8 g/dL (ref 6.0–8.5)
eGFR: 83 mL/min/{1.73_m2} (ref >59)

## 2021-11-24 LAB — LIPID PANEL
Chol/HDL Ratio: 3.7 ratio (ref 0.0–5.0)
Cholesterol, Total: 219 mg/dL — ABNORMAL HIGH (ref 100–199)
HDL: 59 mg/dL (ref >39)
LDL Chol Calc (NIH): 147 mg/dL — ABNORMAL HIGH (ref 0–99)
Triglycerides: 72 mg/dL (ref 0–149)
VLDL Cholesterol Cal: 13 mg/dL (ref 5–40)

## 2021-11-24 LAB — VITAMIN D 25 HYDROXY (VIT D DEFICIENCY, FRACTURES): Vit D, 25-Hydroxy: 30.6 ng/mL (ref 30.0–100.0)

## 2021-11-24 LAB — HEPATITIS C ANTIBODY: Hep C Virus Ab: NONREACTIVE

## 2021-12-14 ENCOUNTER — Telehealth: Payer: Self-pay

## 2021-12-14 NOTE — Telephone Encounter (Signed)
Attempted to return call to patient. No answer. Vm not set up-Joshua Cooke ?

## 2021-12-15 ENCOUNTER — Telehealth: Payer: Self-pay

## 2021-12-15 DIAGNOSIS — R748 Abnormal levels of other serum enzymes: Secondary | ICD-10-CM

## 2021-12-17 ENCOUNTER — Telehealth: Payer: Self-pay

## 2021-12-17 NOTE — Telephone Encounter (Signed)
Voice mail not set up, unable to [facility], need to review labs with pt ?

## 2021-12-17 NOTE — Telephone Encounter (Signed)
Liver ultrasound ordered due to persistently elevated liver enzymes ?

## 2021-12-17 NOTE — Telephone Encounter (Signed)
-----   Message from Jonetta Osgood, NP sent at 12/10/2021  2:40 PM EDT ----- ?Please let patient know the following results: ?-- His metabolic panel is grossly normal, kidney function is normal.  His liver enzymes, AST and ALT, are elevated and they were elevated a year ago as well.  He will need an ultrasound of the liver. ?-- His cholesterol levels are abnormal with an elevated total cholesterol of 219 which is slightly increased from a year ago and elevated LDL of 147 which is also increased from a year ago.  His triglyceride, HDL and VLDL are all within normal limits and his cholesterol/HDL ratio is 3.7 which is less than the average risk for heart disease.  Patient should limit his red meat intake and increase his lean proteins in his diet.  It would be helpful to also start an over-the-counter omega-3 fish oil supplement 1000 mg daily ?--His CBC is normal ?--His vitamin D level was low normal at 30.6, I recommend that he take an over-the-counter vitamin D supplement of 5000 units daily if he is not already taking a supplement. ?--His lab result was negative for hepatitis C ?

## 2021-12-28 ENCOUNTER — Telehealth: Payer: Self-pay

## 2021-12-28 NOTE — Telephone Encounter (Signed)
Sent mychart message to schedule u/s-Toni ?

## 2022-01-05 ENCOUNTER — Telehealth: Payer: Self-pay

## 2022-01-05 NOTE — Telephone Encounter (Signed)
-----   Message from Jonetta Osgood, NP sent at 12/10/2021  2:40 PM EDT ----- ?Please let patient know the following results: ?-- His metabolic panel is grossly normal, kidney function is normal.  His liver enzymes, AST and ALT, are elevated and they were elevated a year ago as well.  He will need an ultrasound of the liver. ?-- His cholesterol levels are abnormal with an elevated total cholesterol of 219 which is slightly increased from a year ago and elevated LDL of 147 which is also increased from a year ago.  His triglyceride, HDL and VLDL are all within normal limits and his cholesterol/HDL ratio is 3.7 which is less than the average risk for heart disease.  Patient should limit his red meat intake and increase his lean proteins in his diet.  It would be helpful to also start an over-the-counter omega-3 fish oil supplement 1000 mg daily ?--His CBC is normal ?--His vitamin D level was low normal at 30.6, I recommend that he take an over-the-counter vitamin D supplement of 5000 units daily if he is not already taking a supplement. ?--His lab result was negative for hepatitis C ?

## 2022-01-05 NOTE — Telephone Encounter (Signed)
Pt is aware of his labs  ?

## 2022-01-17 ENCOUNTER — Ambulatory Visit: Payer: BC Managed Care – PPO

## 2022-01-17 DIAGNOSIS — R748 Abnormal levels of other serum enzymes: Secondary | ICD-10-CM

## 2022-01-28 ENCOUNTER — Encounter: Payer: Self-pay | Admitting: Nurse Practitioner

## 2022-01-28 ENCOUNTER — Ambulatory Visit: Payer: BC Managed Care – PPO | Admitting: Nurse Practitioner

## 2022-01-28 VITALS — BP 143/86 | HR 53 | Temp 98.8°F | Resp 16 | Ht 68.0 in | Wt 191.6 lb

## 2022-02-18 ENCOUNTER — Telehealth: Payer: Self-pay | Admitting: Nurse Practitioner

## 2022-02-18 NOTE — Telephone Encounter (Signed)
Patient called for liver ultrasound results. He has prior elevated liver enzymes on labs. He does drink alcohol some.  Liver showed mild heterogeneous and dense texture consistent with fatty liver.  Discussed healthy diet changes, limiting alcohol consumption, weight loss and increasing physical activity. Patient verbalized and acknowledged understanding of the results and recommendations for diet and lifestyle modifications that will have a positive impact on his liver and prevent further progression of fatty liver. Offered to send patient information about fatty liver and diet modifications but patient declined and stated he is able to research imformation on his own.  I also apologized for the delay in providing this information to this patient in a more timely manner.

## 2022-04-24 NOTE — Progress Notes (Signed)
Pt was not seen.

## 2022-09-01 ENCOUNTER — Encounter: Payer: BC Managed Care – PPO | Admitting: Nurse Practitioner

## 2022-09-15 ENCOUNTER — Encounter: Payer: BC Managed Care – PPO | Admitting: Internal Medicine

## 2022-09-27 ENCOUNTER — Ambulatory Visit: Payer: BC Managed Care – PPO | Admitting: Nurse Practitioner

## 2022-09-27 ENCOUNTER — Encounter: Payer: Self-pay | Admitting: Nurse Practitioner

## 2022-09-27 VITALS — BP 129/91 | HR 64 | Temp 97.6°F | Resp 16 | Ht 68.0 in | Wt 200.6 lb

## 2022-09-27 DIAGNOSIS — M7711 Lateral epicondylitis, right elbow: Secondary | ICD-10-CM | POA: Diagnosis not present

## 2022-09-27 DIAGNOSIS — R21 Rash and other nonspecific skin eruption: Secondary | ICD-10-CM | POA: Diagnosis not present

## 2022-09-27 MED ORDER — TRIAMCINOLONE ACETONIDE 0.1 % EX CREA
1.0000 | TOPICAL_CREAM | Freq: Two times a day (BID) | CUTANEOUS | 2 refills | Status: DC
Start: 2022-09-27 — End: 2023-08-02

## 2022-09-27 MED ORDER — MELOXICAM 7.5 MG PO TABS: 7.5000 mg | ORAL_TABLET | Freq: Every day | ORAL | 0 refills | Status: DC

## 2022-09-27 NOTE — Progress Notes (Signed)
[facility] [place], [place] [address]  Internal MEDICINE  Office Visit Note  Patient Name: Joshua Cooke  [id number]  [id number]  Date of Service: 09/27/2022  Chief Complaint  Patient presents with   Acute Visit    Right arm pain   Hyperlipidemia     HPI Joshua Cooke presents for an acute sick visit for right arm pain Right elbow pain -- weakened grip to right hand, lateral side of elbow. 4 weeks ago was using tools and did not have good grip trying to move something heavy.  --dry patch on left buttocks -- eczema.      Current Medication:  Outpatient Encounter Medications as of 09/27/2022  Medication Sig   meloxicam (MOBIC) 7.5 MG tablet Take 1-2 tablets (7.5-15 mg total) by mouth daily.   triamcinolone cream (KENALOG) 0.1 % Apply 1 Application topically 2 (two) times daily. To affected area until resolved.   No facility-administered encounter medications on file as of 09/27/2022.      Medical History: Past Medical History:  Diagnosis Date   Hyperlipidemia      Vital Signs: BP (!) 129/91   Pulse 64   Temp 97.6 F (36.4 C)   Resp 16   Ht '5\' 8"'$  (1.727 m)   Wt 200 lb 9.6 oz (91 kg)   SpO2 97%   BMI 30.50 kg/m    Review of Systems  Respiratory: Negative.  Negative for cough, chest tightness, shortness of breath and wheezing.   Cardiovascular: Negative.  Negative for chest pain and palpitations.  Musculoskeletal:  Positive for arthralgias (right elbow) and myalgias.  Skin:  Positive for rash (on left buttocks).    Physical Exam Vitals reviewed.  Constitutional:      Appearance: Normal appearance.  HENT:     Head: Normocephalic and atraumatic.  Eyes:     Pupils: Pupils are equal, round, and reactive to light.  Cardiovascular:     Rate and Rhythm: Normal rate and regular rhythm.  Pulmonary:     Effort: Pulmonary effort is normal. No respiratory distress.  Skin:    Findings: Rash present. Rash is macular and scaling.           Comments: Scaly patch of eczema at marked area on diagram  Neurological:     Mental Status: He is alert and oriented to person, place, and time.  Psychiatric:        Mood and Affect: Mood normal.        Behavior: Behavior normal.       Assessment/Plan: 1. Lateral epicondylitis of right elbow Information and handouts with stretches to try as well as prescription NSAID ordered.  - meloxicam (MOBIC) 7.5 MG tablet; Take 1-2 tablets (7.5-15 mg total) by mouth daily.  Dispense: 60 tablet; Refill: 0  2. Scaly patch rash Topical steroid prescribed - triamcinolone cream (KENALOG) 0.1 %; Apply 1 Application topically 2 (two) times daily. To affected area until resolved.  Dispense: 45 g; Refill: 2   General Counseling: Joshua Cooke understanding of the findings of todays visit and agrees with plan of treatment. I have discussed any further diagnostic evaluation that may be needed or ordered today. We also reviewed his medications today. he has been encouraged to call the office with any questions or concerns that should arise related to todays visit.    Counseling:    No orders of the defined types were placed in this encounter.   Meds ordered this encounter  Medications   meloxicam (  MOBIC) 7.5 MG tablet    Sig: Take 1-2 tablets (7.5-15 mg total) by mouth daily.    Dispense:  60 tablet    Refill:  0   triamcinolone cream (KENALOG) 0.1 %    Sig: Apply 1 Application topically 2 (two) times daily. To affected area until resolved.    Dispense:  45 g    Refill:  2    Return for previously scheduled, CPE, Joshua Cooke PCP in february.  [place] Controlled Substance Database was reviewed by me for overdose risk score (ORS)  Time spent:30 Minutes Time spent with patient included reviewing progress notes, labs, imaging studies, and discussing plan for follow up.   This patient was seen by Joshua Osgood, FNP-C in collaboration with Dr. Clayborn Cooke as a part of collaborative care  agreement.  Joshua Cullins R. Valetta Fuller, MSN, FNP-C Internal Medicine

## 2022-09-28 ENCOUNTER — Other Ambulatory Visit: Payer: Self-pay

## 2022-09-28 ENCOUNTER — Telehealth: Payer: Self-pay

## 2022-09-28 MED ORDER — MELOXICAM 15 MG PO TABS: 15.0000 mg | ORAL_TABLET | Freq: Every day | ORAL | 1 refills | Status: DC

## 2022-09-28 NOTE — Telephone Encounter (Signed)
As per alyssa change meloxicam 15 mg and advised pt to take 1/2 to 1 tab daily try first with 7.5 mg if not take 1 tab po daily for meloxicam 15 mg

## 2022-10-20 ENCOUNTER — Encounter: Payer: BC Managed Care – PPO | Admitting: Nurse Practitioner

## 2022-11-14 ENCOUNTER — Encounter: Payer: Self-pay | Admitting: Nurse Practitioner

## 2022-11-14 ENCOUNTER — Ambulatory Visit (INDEPENDENT_AMBULATORY_CARE_PROVIDER_SITE_OTHER): Payer: BC Managed Care – PPO | Admitting: Nurse Practitioner

## 2022-11-14 VITALS — BP 130/88 | HR 70 | Temp 97.7°F | Resp 16 | Ht 68.0 in | Wt 201.8 lb

## 2022-11-14 DIAGNOSIS — R748 Abnormal levels of other serum enzymes: Secondary | ICD-10-CM | POA: Diagnosis not present

## 2022-11-14 DIAGNOSIS — Z1211 Encounter for screening for malignant neoplasm of colon: Secondary | ICD-10-CM

## 2022-11-14 DIAGNOSIS — E782 Mixed hyperlipidemia: Secondary | ICD-10-CM | POA: Diagnosis not present

## 2022-11-14 DIAGNOSIS — Z125 Encounter for screening for malignant neoplasm of prostate: Secondary | ICD-10-CM

## 2022-11-14 DIAGNOSIS — R300 Dysuria: Secondary | ICD-10-CM | POA: Diagnosis not present

## 2022-11-14 DIAGNOSIS — M7711 Lateral epicondylitis, right elbow: Secondary | ICD-10-CM

## 2022-11-14 DIAGNOSIS — Z0001 Encounter for general adult medical examination with abnormal findings: Secondary | ICD-10-CM | POA: Diagnosis not present

## 2022-11-14 DIAGNOSIS — Z1212 Encounter for screening for malignant neoplasm of rectum: Secondary | ICD-10-CM

## 2022-11-14 NOTE — Addendum Note (Signed)
Addended by: Toma Deiters on: 11/14/2022 03:44 PM   Modules accepted: Orders

## 2022-11-14 NOTE — Progress Notes (Signed)
[facility] [place], [place] [address]  Internal MEDICINE  Office Visit Note  Patient Name: Joshua Cooke  [id number]  [id number]  Date of Service: 11/14/2022  Chief Complaint  Patient presents with   Hyperlipidemia   Annual Exam    HPI Joshua Cooke presents for an annual well visit and physical exam.  Well-appearing 52 y.o. male with no major medical problems Routine CRC screening: due now, opt for cologuard.  Labs: due for routine labs, including PSA New or worsening pain: right elbow still hurting, meloxicam did not help much. Wants to know next steps Other concerns:  none    Current Medication: Outpatient Encounter Medications as of 11/14/2022  Medication Sig   meloxicam (MOBIC) 15 MG tablet Take 1 tablet (15 mg total) by mouth daily.   triamcinolone cream (KENALOG) 0.1 % Apply 1 Application topically 2 (two) times daily. To affected area until resolved.   No facility-administered encounter medications on file as of 11/14/2022.    Surgical History: Past Surgical History:  Procedure Laterality Date   TYMPANOSTOMY TUBE PLACEMENT Bilateral     Medical History: Past Medical History:  Diagnosis Date   Hyperlipidemia     Family History: Family History  Problem Relation Age of Onset   Hyperlipidemia Father    Heart disease Father     Social History   Socioeconomic History   Marital status: Married    Spouse name: Not on file   Number of children: Not on file   Years of education: Not on file   Highest education level: Not on file  Occupational History   Not on file  Tobacco Use   Smoking status: Former   Smokeless tobacco: Never   Tobacco comments:    stopped about 7-8 years ago reported on 08/09/2019  Vaping Use   Vaping Use: Never used  Substance and Sexual Activity   Alcohol use: Yes    Alcohol/week: 3.0 standard drinks of alcohol    Types: 3 Cans of beer per week   Drug use: Never   Sexual activity: Not on file  Other  Topics Concern   Not on file  Social History Narrative   Not on file   Social Determinants of Health   Financial Resource Strain: Not on file  Food Insecurity: Not on file  Transportation Needs: Not on file  Physical Activity: Not on file  Stress: Not on file  Social Connections: Not on file  Intimate Partner Violence: Not on file      Review of Systems  Constitutional:  Negative for activity change, appetite change, chills, fatigue, fever and unexpected weight change.  HENT: Negative.  Negative for congestion, ear pain, rhinorrhea, sore throat and trouble swallowing.   Eyes: Negative.   Respiratory: Negative.  Negative for cough, chest tightness, shortness of breath and wheezing.   Cardiovascular: Negative.  Negative for chest pain and palpitations.  Gastrointestinal: Negative.  Negative for abdominal pain, blood in stool, constipation, diarrhea, nausea and vomiting.  Endocrine: Negative.   Genitourinary: Negative.  Negative for difficulty urinating, dysuria, frequency, hematuria and urgency.  Musculoskeletal:  Positive for arthralgias (right elbow) and myalgias. Negative for back pain, joint swelling and neck pain.  Skin:  Positive for rash (on left buttocks). Negative for wound.  Allergic/Immunologic: Negative.  Negative for immunocompromised state.  Neurological: Negative.  Negative for dizziness, seizures, numbness and headaches.  Hematological: Negative.   Psychiatric/Behavioral: Negative.  Negative for behavioral problems, self-injury and suicidal ideas. The patient is not  nervous/anxious.     Vital Signs: BP 130/88 Comment: 153/97  Pulse 70   Temp 97.7 F (36.5 C)   Resp 16   Ht '5\' 8"'$  (1.727 m)   Wt 201 lb 12.8 oz (91.5 kg)   SpO2 98%   BMI 30.68 kg/m    Physical Exam Vitals reviewed.  Constitutional:      General: He is awake. He is not in acute distress.    Appearance: Normal appearance. He is well-developed and well-groomed. He is obese. He is not  ill-appearing or diaphoretic.  HENT:     Head: Normocephalic and atraumatic.     Right Ear: Tympanic membrane, ear canal and external ear normal.     Left Ear: Tympanic membrane, ear canal and external ear normal.     Nose: Nose normal. No congestion or rhinorrhea.     Mouth/Throat:     Lips: Pink.     Mouth: Mucous membranes are moist.     Pharynx: Oropharynx is clear. Uvula midline. No oropharyngeal exudate.  Eyes:     General: Lids are normal. Vision grossly intact. Gaze aligned appropriately. No scleral icterus.       Right eye: No discharge.        Left eye: No discharge.     Extraocular Movements: Extraocular movements intact.     Conjunctiva/sclera: Conjunctivae normal.     Pupils: Pupils are equal, round, and reactive to light.     Funduscopic exam:    Right eye: Red reflex present.        Left eye: Red reflex present. Neck:     Thyroid: No thyromegaly.     Vascular: No JVD.     Trachea: Trachea and phonation normal. No tracheal deviation.  Cardiovascular:     Rate and Rhythm: Normal rate and regular rhythm.     Pulses: Normal pulses.     Heart sounds: Normal heart sounds, S1 normal and S2 normal. No murmur heard.    No friction rub. No gallop.  Pulmonary:     Effort: Pulmonary effort is normal. No accessory muscle usage or respiratory distress.     Breath sounds: Normal breath sounds and air entry. No stridor. No wheezing or rales.  Chest:     Chest wall: No tenderness.  Abdominal:     General: Bowel sounds are normal. There is no distension.     Palpations: Abdomen is soft. There is no shifting dullness, fluid wave, mass or pulsatile mass.     Tenderness: There is no abdominal tenderness. There is no guarding or rebound.  Musculoskeletal:        General: No tenderness or deformity. Normal range of motion.     Cervical back: Normal range of motion and neck supple.     Right lower leg: No edema.     Left lower leg: No edema.  Lymphadenopathy:     Cervical: No  cervical adenopathy.  Skin:    General: Skin is warm and dry.     Capillary Refill: Capillary refill takes less than 2 seconds.     Coloration: Skin is not pale.     Findings: No erythema or rash.  Neurological:     Mental Status: He is alert and oriented to person, place, and time.     Cranial Nerves: No cranial nerve deficit.     Motor: No abnormal muscle tone.     Coordination: Coordination normal.     Gait: Gait normal.     Deep Tendon  Reflexes: Reflexes are normal and symmetric.  Psychiatric:        Mood and Affect: Mood normal.        Behavior: Behavior normal. Behavior is cooperative.        Thought Content: Thought content normal.        Judgment: Judgment normal.        Assessment/Plan: 1. Encounter for general adult medical examination with abnormal findings Age-appropriate preventive screenings and vaccinations discussed, annual physical exam completed. Routine labs for health maintenance ordered, see below. PHM updated.  - CBC with Differential/Platelet - CMP14+EGFR - Lipid Profile - PSA Total (Reflex To Free)  2. Lateral epicondylitis of right elbow Referred to orthopedic specialist - Ambulatory referral to Orthopedic Surgery  3. Mixed hyperlipidemia Routine labs ordered - CBC with Differential/Platelet - CMP14+EGFR - Lipid Profile  4. Elevated liver enzymes Routine labs ordered - CBC with Differential/Platelet - CMP14+EGFR - Lipid Profile  5. Screening for colorectal cancer Cologuard ordered - Cologuard  6. Screening for prostate cancer Routine lab ordered - PSA Total (Reflex To Free)      General Counseling: [name] understanding of the findings of todays visit and agrees with plan of treatment. I have discussed any further diagnostic evaluation that may be needed or ordered today. We also reviewed his medications today. he has been encouraged to call the office with any questions or concerns that should arise related to todays  visit.    Orders Placed This Encounter  Procedures   Cologuard   CBC with Differential/Platelet   CMP14+EGFR   Lipid Profile   PSA Total (Reflex To Free)   Ambulatory referral to Orthopedic Surgery    No orders of the defined types were placed in this encounter.   Return in about 1 year (around 11/15/2023) for CPE, [place] PCP.   Total time spent:30 Minutes Time spent includes review of chart, medications, test results, and follow up plan with the patient.    Controlled Substance Database was reviewed by me.  This patient was seen by Jonetta Osgood, FNP-C in collaboration with Dr. Clayborn Bigness as a part of collaborative care agreement.  Ila Landowski R. Valetta Fuller, MSN, FNP-C Internal medicine

## 2022-11-15 LAB — UA/M W/RFLX CULTURE, ROUTINE
Bilirubin, UA: NEGATIVE
Glucose, UA: NEGATIVE
Ketones, UA: NEGATIVE
Leukocytes,UA: NEGATIVE
Nitrite, UA: NEGATIVE
Protein,UA: NEGATIVE
RBC, UA: NEGATIVE
Specific Gravity, UA: 1.008 (ref 1.005–1.030)
Urobilinogen, Ur: 0.2 mg/dL (ref 0.2–1.0)
pH, UA: 6 (ref 5.0–7.5)

## 2022-11-15 LAB — MICROSCOPIC EXAMINATION
Bacteria, UA: NONE SEEN
Casts: NONE SEEN /lpf
Epithelial Cells (non renal): NONE SEEN /hpf (ref 0–10)
RBC, Urine: NONE SEEN /hpf (ref 0–2)
WBC, UA: NONE SEEN /hpf (ref 0–5)

## 2022-11-18 ENCOUNTER — Telehealth: Payer: Self-pay | Admitting: Nurse Practitioner

## 2022-11-18 NOTE — Telephone Encounter (Signed)
Orthopedic referral sent via [facility] to [facility]

## 2022-11-24 ENCOUNTER — Telehealth: Payer: Self-pay | Admitting: Nurse Practitioner

## 2022-11-24 NOTE — Telephone Encounter (Signed)
Orthopedic appointment>> 11/25/22 with [facility]

## 2022-11-25 DIAGNOSIS — M778 Other enthesopathies, not elsewhere classified: Secondary | ICD-10-CM | POA: Diagnosis not present

## 2022-11-25 DIAGNOSIS — M7711 Lateral epicondylitis, right elbow: Secondary | ICD-10-CM | POA: Diagnosis not present

## 2022-11-27 DIAGNOSIS — Z1212 Encounter for screening for malignant neoplasm of rectum: Secondary | ICD-10-CM | POA: Diagnosis not present

## 2022-11-27 DIAGNOSIS — Z1211 Encounter for screening for malignant neoplasm of colon: Secondary | ICD-10-CM | POA: Diagnosis not present

## 2022-12-05 LAB — COLOGUARD: COLOGUARD: NEGATIVE

## 2022-12-12 NOTE — Progress Notes (Signed)
Cologuard is negative.

## 2023-06-20 ENCOUNTER — Ambulatory Visit
Admission: RE | Admit: 2023-06-20 | Discharge: 2023-06-20 | Disposition: A | Discharge status: Home / Self Care | Payer: BC Managed Care – PPO | Source: Ambulatory Visit | Attending: Emergency Medicine | Admitting: Emergency Medicine

## 2023-06-20 ENCOUNTER — Ambulatory Visit (INDEPENDENT_AMBULATORY_CARE_PROVIDER_SITE_OTHER)
Admit: 2023-06-20 | Discharge: 2023-06-20 | Disposition: A | Discharge status: Home / Self Care | Payer: BC Managed Care – PPO | Attending: Emergency Medicine | Admitting: Emergency Medicine

## 2023-06-20 VITALS — BP 132/87 | HR 73 | Temp 98.6°F | Resp 18

## 2023-06-20 DIAGNOSIS — R932 Abnormal findings on diagnostic imaging of liver and biliary tract: Secondary | ICD-10-CM | POA: Diagnosis not present

## 2023-06-20 DIAGNOSIS — R1011 Right upper quadrant pain: Secondary | ICD-10-CM | POA: Diagnosis not present

## 2023-06-20 LAB — CBC WITH DIFFERENTIAL/PLATELET
Abs Immature Granulocytes: 0.02 10*3/uL (ref 0.00–0.07)
Basophils Absolute: 0 10*3/uL (ref 0.0–0.1)
Basophils Relative: 1 %
Eosinophils Absolute: 0.1 10*3/uL (ref 0.0–0.5)
Eosinophils Relative: 2 %
HCT: 42.3 % (ref 39.0–52.0)
Hemoglobin: 15.3 g/dL (ref 13.0–17.0)
Immature Granulocytes: 0 %
Lymphocytes Relative: 17 %
Lymphs Abs: 1.1 10*3/uL (ref 0.7–4.0)
MCH: 34.7 pg — ABNORMAL HIGH (ref 26.0–34.0)
MCHC: 36.2 g/dL — ABNORMAL HIGH (ref 30.0–36.0)
MCV: 95.9 fL (ref 80.0–100.0)
Monocytes Absolute: 0.6 10*3/uL (ref 0.1–1.0)
Monocytes Relative: 9 %
Neutro Abs: 4.6 10*3/uL (ref 1.7–7.7)
Neutrophils Relative %: 71 %
Platelets: 158 10*3/uL (ref 150–400)
RBC: 4.41 MIL/uL (ref 4.22–5.81)
RDW: 12.1 % (ref 11.5–15.5)
WBC: 6.4 10*3/uL (ref 4.0–10.5)
nRBC: 0 % (ref 0.0–0.2)

## 2023-06-20 LAB — URINALYSIS, W/ REFLEX TO CULTURE (INFECTION SUSPECTED)
Glucose, UA: NEGATIVE mg/dL
Hgb urine dipstick: NEGATIVE
Ketones, ur: NEGATIVE mg/dL
Leukocytes,Ua: NEGATIVE
Nitrite: NEGATIVE
Protein, ur: NEGATIVE mg/dL
Specific Gravity, Urine: 1.015 (ref 1.005–1.030)
pH: 6.5 (ref 5.0–8.0)

## 2023-06-20 LAB — LIPASE, BLOOD: Lipase: 31 U/L (ref 11–51)

## 2023-06-20 LAB — COMPREHENSIVE METABOLIC PANEL
ALT: 52 U/L — ABNORMAL HIGH (ref 0–44)
AST: 47 U/L — ABNORMAL HIGH (ref 15–41)
Albumin: 4.3 g/dL (ref 3.5–5.0)
Alkaline Phosphatase: 55 U/L (ref 38–126)
Anion gap: 8 (ref 5–15)
BUN: 14 mg/dL (ref 6–20)
CO2: 27 mmol/L (ref 22–32)
Calcium: 9.2 mg/dL (ref 8.9–10.3)
Chloride: 98 mmol/L (ref 98–111)
Creatinine, Ser: 1.02 mg/dL (ref 0.61–1.24)
GFR, Estimated: >60 mL/min (ref >60)
Glucose, Bld: 104 mg/dL — ABNORMAL HIGH (ref 70–99)
Potassium: 4.1 mmol/L (ref 3.5–5.1)
Sodium: 133 mmol/L — ABNORMAL LOW (ref 135–145)
Total Bilirubin: 1.8 mg/dL — ABNORMAL HIGH (ref 0.3–1.2)
Total Protein: 8.5 g/dL — ABNORMAL HIGH (ref 6.5–8.1)

## 2023-06-20 NOTE — ED Provider Notes (Signed)
[facility]    CSN: [id number] Arrival date & time: 06/20/23  1247      History   Chief Complaint Chief Complaint  Patient presents with   Abdominal Pain    Pain in upper right side of abdomen - Entered by patient    HPI JONTE SHILLER is a 52 y.o. male.   HPI  52 year old male with a past medical history significant for hyperlipidemia presents for evaluation of continuous right upper quadrant abdominal pain that is been going on for last 2 days.  He states that deep breathing and movement increase the pain but it is not necessarily worsened by eating.  No associated fever, nausea, vomiting, diarrhea, or constipation.  Patient denies any urinary symptoms or hematuria.  Past Medical History:  Diagnosis Date   Hyperlipidemia     Patient Active Problem List   Diagnosis Date Noted   Patellofemoral stress syndrome 09/15/2021   Onychomycosis of great toe 09/08/2020   Screening for colon cancer 09/08/2020   Hyperkalemia 09/09/2019   Dysuria 08/18/2019   Orthostatic hypotension 08/18/2019   Near syncope 08/18/2019   Encounter for general adult medical examination with abnormal findings 08/06/2018   Lipoma of neck 08/06/2018   Hyperlipidemia 08/06/2018   Bell's palsy 10/19/2016    Past Surgical History:  Procedure Laterality Date   TYMPANOSTOMY TUBE PLACEMENT Bilateral        Home Medications    Prior to Admission medications   Medication Sig Start Date End Date Taking? Authorizing Provider  meloxicam (MOBIC) 15 MG tablet Take 1 tablet (15 mg total) by mouth daily. 09/28/22   Sallyanne Kuster, NP  triamcinolone cream (KENALOG) 0.1 % Apply 1 Application topically 2 (two) times daily. To affected area until resolved. 09/27/22   Sallyanne Kuster, NP    Family History Family History  Problem Relation Age of Onset   Hyperlipidemia Father    Heart disease Father     Social History Social History   Tobacco Use   Smoking status: Former   Smokeless  tobacco: Never   Tobacco comments:    stopped about 7-8 years ago reported on 08/09/2019  Vaping Use   Vaping status: Never Used  Substance Use Topics   Alcohol use: Yes    Alcohol/week: 3.0 standard drinks of alcohol    Types: 3 Cans of beer per week   Drug use: Never     Allergies   Patient has no known allergies.   Review of Systems Review of Systems  Constitutional:  Negative for fever.  Gastrointestinal:  Positive for abdominal pain. Negative for diarrhea, nausea and vomiting.  Genitourinary:  Negative for dysuria, frequency, hematuria and urgency.     Physical Exam Triage Vital Signs ED Triage Vitals  Encounter Vitals Group     BP      Systolic BP Percentile      Diastolic BP Percentile      Pulse      Resp      Temp      Temp src      SpO2      Weight      Height      Head Circumference      Peak Flow      Pain Score      Pain Loc      Pain Education      Exclude from Growth Chart    No data found.  Updated Vital Signs BP 132/87 (BP Location: Left Arm)  Pulse 73   Temp 98.6 F (37 C) (Oral)   Resp 18   SpO2 99%   Visual Acuity Right Eye Distance:   Left Eye Distance:   Bilateral Distance:    Right Eye Near:   Left Eye Near:    Bilateral Near:     Physical Exam Vitals and nursing note reviewed.  Constitutional:      Appearance: Normal appearance. He is not ill-appearing.  HENT:     Head: Normocephalic and atraumatic.  Cardiovascular:     Rate and Rhythm: Normal rate and regular rhythm.     Pulses: Normal pulses.     Heart sounds: Normal heart sounds. No murmur heard.    No friction rub. No gallop.  Pulmonary:     Effort: Pulmonary effort is normal.     Breath sounds: Normal breath sounds. No wheezing, rhonchi or rales.  Abdominal:     General: Abdomen is flat.     Palpations: Abdomen is soft.     Tenderness: There is abdominal tenderness. There is no right CVA tenderness, left CVA tenderness, guarding or rebound.     Comments:  Patient has mild epigastric and right upper quadrant tenderness.  No guarding or rebound.  No CVA tenderness noted on exam.  Skin:    General: Skin is warm.     Capillary Refill: Capillary refill takes less than 2 seconds.  Neurological:     General: No focal deficit present.     Mental Status: He is alert and oriented to person, place, and time.      UC Treatments / Results  Labs (all labs ordered are listed, but only abnormal results are displayed) Labs Reviewed  CBC WITH DIFFERENTIAL/PLATELET - Abnormal; Notable for the following components:      Result Value   MCH 34.7 (*)    MCHC 36.2 (*)    All other components within normal limits  COMPREHENSIVE METABOLIC PANEL - Abnormal; Notable for the following components:   Sodium 133 (*)    Glucose, Bld 104 (*)    Total Protein 8.5 (*)    AST 47 (*)    ALT 52 (*)    Total Bilirubin 1.8 (*)    All other components within normal limits  URINALYSIS, W/ REFLEX TO CULTURE (INFECTION SUSPECTED) - Abnormal; Notable for the following components:   Color, Urine AMBER (*)    Bilirubin Urine SMALL (*)    Bacteria, UA FEW (*)    All other components within normal limits  LIPASE, BLOOD    EKG   Radiology US Abdomen Limited RUQ (LIVER/GB)  Result Date: 06/20/2023 CLINICAL DATA:  Right upper quadrant pain EXAM: ULTRASOUND ABDOMEN LIMITED RIGHT UPPER QUADRANT COMPARISON:  None Available. FINDINGS: Gallbladder: No gallstones or wall thickening visualized. No sonographic Murphy sign noted by sonographer. Common bile duct: Diameter: 2.6 mm Liver: Increased echogenicity. No focal lesion. Portal vein is patent on color Doppler imaging with normal direction of blood flow towards the liver. Other: None. IMPRESSION: 1. No cholelithiasis or sonographic evidence for acute cholecystitis. 2. Increased hepatic parenchymal echogenicity suggestive of steatosis. Electronically Signed   By: Annia Belt M.D.   On: 06/20/2023 15:15    Procedures Procedures  (including critical care time)  Medications Ordered in UC Medications - No data to display  Initial Impression / Assessment and Plan / UC Course  I have reviewed the triage vital signs and the nursing notes.  Pertinent labs & imaging results that were available during my care  of the patient were reviewed by me and considered in my medical decision making (see chart for details).   Patient is a pleasant, nontoxic-appearing 52 year old male presenting for evaluation of 2 days worth of right upper quadrant abdominal pain that radiates around to the right flank and is increased by movement and also deep breathing.  He denies any noticeable increase in pain with food consumption.  He reports that the pain began after he experienced a cramp in his sternum.  On exam his abdomen is soft and flat but he does have mild epigastric and right upper quadrant tenderness.  No CVA tenderness on exam he denies any urinary symptoms.  He reports that he does still have his gallbladder.  Given patient's symptoms there is concern for possible cholecystitis.  The patient symptoms may also be caused by a a strain of his chest wall though he is not tender with palpation over the lower ribs or intercostal spaces on the right side of the chest.  Also concerned with the possibility of a kidney stone though he has no CVA tenderness and he denies any dysuria or hematuria.  I will order CBC, CMP, lipase, UA, and right upper quadrant ultrasound.  CBC shows a normal white count of 6.4.  H&H is also normal at 15.3 and 42.3 respectively.  Platelets normal at 158.  CMP shows mild hyponatremia with a sodium of 133.  Glucose is mildly elevated 104.  Renal function is normal.  Transaminases reveal elevated AST, ALT, and T. bili.  Alk phos is normal.  Patient also has elevation of total protein.  Lipase is normal at 31.  Urinalysis is amber in appearance with small bilirubin but it is negative for blood, leukocyte esterase, nitrates, or  protein.  Reflex microscopy is unremarkable.  Patient is requesting to go home and be called with the results of his ultrasound.  I explained to him that he does have mild elevation of his transaminases which could be indicative of gallbladder disease but he does not have any signs of acute infection or renal stone.  I have advised him to follow a bland diet and use over-the-counter Tylenol and/or ibuprofen according to pack instructions as needed for pain.  If his ultrasound is positive for gallstones but negative for cholecystitis I will refer him to general surgery to discuss treatment options.  I have advised him that if he develops any increase in abdominal pain, develops fever, or develops nausea and vomiting and cannot keep down medications or fluids that he should go to the ER for evaluation.  Gallbladder ultrasound reveals no gallstones or acute cholecystitis.  Patient does have hepatic steatosis.  I will discharge patient home with a diagnosis of right upper quadrant pain and have him use over-the-counter analgesia as described above.  If his symptoms not improve, they worsen, he can either return for reevaluation, see his PCP, or seek care in the ER.  Patient updated regarding his ultrasound results via telephone after verifying his name and date of birth.  Final Clinical Impressions(s) / UC Diagnoses   Final diagnoses:  Right upper quadrant abdominal pain     Discharge Instructions      Your lab work was very reassuring and your ultrasound was negative for the gallstones or inflamed gallbladder.  It did show that you have a fatty liver however.  Continue to follow a bland diet and avoid fatty or greasy foods.  Use over-the-counter analgesia such as, or ibuprofen as needed for pain.  If  you develop any increased abdominal pain, nausea and vomiting cannot keep down fluids or medications, or fever you need to be reevaluated in the ER.  If your symptoms persist I recommend following  up with your PCP.     ED Prescriptions   None    PDMP not reviewed this encounter.   Becky Augusta, NP 06/20/23 1526

## 2023-06-20 NOTE — Discharge Instructions (Addendum)
Your lab work was very reassuring and your ultrasound was negative for the gallstones or inflamed gallbladder.  It did show that you have a fatty liver however.  Continue to follow a bland diet and avoid fatty or greasy foods.  Use over-the-counter analgesia such as, or ibuprofen as needed for pain.  If you develop any increased abdominal pain, nausea and vomiting cannot keep down fluids or medications, or fever you need to be reevaluated in the ER.  If your symptoms persist I recommend following up with your PCP.

## 2023-06-20 NOTE — ED Triage Notes (Addendum)
RUQ pain x 2 days. No fever-nausea or diarrhea

## 2023-06-22 ENCOUNTER — Ambulatory Visit: Payer: BC Managed Care – PPO | Admitting: Physician Assistant

## 2023-06-22 ENCOUNTER — Encounter: Payer: Self-pay | Admitting: Physician Assistant

## 2023-06-22 VITALS — BP 125/65 | HR 73 | Temp 98.0°F | Resp 16 | Ht 68.0 in | Wt 197.4 lb

## 2023-06-22 DIAGNOSIS — K219 Gastro-esophageal reflux disease without esophagitis: Secondary | ICD-10-CM

## 2023-06-22 DIAGNOSIS — R1011 Right upper quadrant pain: Secondary | ICD-10-CM | POA: Diagnosis not present

## 2023-06-22 DIAGNOSIS — R198 Other specified symptoms and signs involving the digestive system and abdomen: Secondary | ICD-10-CM | POA: Diagnosis not present

## 2023-06-22 DIAGNOSIS — K760 Fatty (change of) liver, not elsewhere classified: Secondary | ICD-10-CM

## 2023-06-22 DIAGNOSIS — T148XXA Other injury of unspecified body region, initial encounter: Secondary | ICD-10-CM

## 2023-06-22 MED ORDER — CYCLOBENZAPRINE HCL 5 MG PO TABS
5.0000 mg | ORAL_TABLET | Freq: Three times a day (TID) | ORAL | 1 refills | Status: DC | PRN
Start: 2023-06-22 — End: 2023-08-02

## 2023-06-22 NOTE — Progress Notes (Signed)
[facility] [address] [place], [place] [address]  Internal MEDICINE  Office Visit Note  Patient Name: Joshua Cooke  [id number]  [id number]  Date of Service: 06/22/2023  Chief Complaint  Patient presents with   Acute Visit   Muscle Pain    Upper RT quadrant pain and right shoulder pain     HPI Pt is here for a sick visit. -Had a cramp along upper abdomen/epigastric region when reaching down over the weekend. Then the next day had some RUQ pain but was dull 2/10 and then with moving would go up to 5-6/10.  -Went to walk in clinic Tues when not feeling better and labs and RUQ US done. No gallstone or cholecystitis seen, LFTS mildly elevated and signs of hepatic steatosis but otherwise normal. -Last night started having pain in right shoulder -When he takes a deep breath has pain in RUQ and right shoulder at the same time. Pain in shoulder/neck is reproducible and is occurring in office outside of RUQ pain. -No injury he is aware of -taking ibuprofen which has helped some -No CP, not SOB other than pain with deep breath -Did discuss if any new or worsening symptoms to go to ED -No urinary symptoms or changes -no rash present or burning as sign of shingles -Does not think it is related, but does mention that no matter what he eats at lunch he will have non-solid stools within of eating and this has been the case for about a year, no recent change. States he will have multiple BM per day for the last year -heartburn a little worse over the last month, though better this week. Will go ahead and try 20mg  omeprazole to see if this helps with some indigestion -No bloating or nausea -did cologuard 8 months ago which was negative -Discussed GI referral due to change in BM  Current Medication:  Outpatient Encounter Medications as of 06/22/2023  Medication Sig   cyclobenzaprine (FLEXERIL) 5 MG tablet Take 1 tablet (5 mg total) by mouth 3 (three) times daily as  needed for muscle spasms.   triamcinolone cream (KENALOG) 0.1 % Apply 1 Application topically 2 (two) times daily. To affected area until resolved.   [DISCONTINUED] meloxicam (MOBIC) 15 MG tablet Take 1 tablet (15 mg total) by mouth daily.   No facility-administered encounter medications on file as of 06/22/2023.      Medical History: Past Medical History:  Diagnosis Date   Hyperlipidemia      Vital Signs: BP 125/65   Pulse 73   Temp 98 F (36.7 C)   Resp 16   Ht 5\' 8"  (1.727 m)   Wt 197 lb 6.4 oz (89.5 kg)   SpO2 93%   BMI 30.01 kg/m    Review of Systems  Constitutional:  Negative for fatigue and fever.  HENT:  Negative for congestion, mouth sores and postnasal drip.   Respiratory:  Negative for cough.   Cardiovascular:  Negative for chest pain.  Gastrointestinal:  Positive for abdominal pain and diarrhea. Negative for nausea and vomiting.  Genitourinary:  Negative for dysuria, flank pain and hematuria.  Musculoskeletal:  Positive for arthralgias and myalgias.  Skin:  Negative for rash.  Psychiatric/Behavioral: Negative.      Physical Exam Vitals and nursing note reviewed.  Constitutional:      General: He is not in acute distress.    Appearance: Normal appearance.  HENT:     Head: Normocephalic and atraumatic.  Cardiovascular:  Rate and Rhythm: Normal rate and regular rhythm.  Pulmonary:     Effort: Pulmonary effort is normal.     Breath sounds: Normal breath sounds.  Abdominal:     General: There is no distension.     Tenderness: There is abdominal tenderness. There is no guarding or rebound.     Comments: Mild abdominal tenderness, RUQ/epigastric  Musculoskeletal:        General: Tenderness present.     Comments: Muscle tension and tenderness along right trapezius  Skin:    General: Skin is warm and dry.  Neurological:     Mental Status: He is alert and oriented to person, place, and time.  Psychiatric:        Mood and Affect: Mood normal.         Behavior: Behavior normal.       Assessment/Plan: 1. Muscle strain May take ibuprofen and will use flexeril as needed. Aware of possible grogginess and requests lower dose. If new or worsening symptoms go to ED - cyclobenzaprine (FLEXERIL) 5 MG tablet; Take 1 tablet (5 mg total) by mouth 3 (three) times daily as needed for muscle spasms.  Dispense: 30 tablet; Refill: 1  2. Change in bowel movement Frequent BM with diarrhea after eating lunch daily for 1 year, will refer to GI - Ambulatory referral to Gastroenterology  3. Gastroesophageal reflux disease, unspecified whether esophagitis present Will start on 20mg  omeprazole OTC - Ambulatory referral to Gastroenterology  4. RUQ pain [place] neg for acute cholecystitis, however will continue to monitor and pt advised to go to ED if new or worsening symptoms  5. Hepatic steatosis Seen on [place], will need to monitor LFTs   General Counseling: yoshio seliga understanding of the findings of todays visit and agrees with plan of treatment. I have discussed any further diagnostic evaluation that may be needed or ordered today. We also reviewed his medications today. he has been encouraged to call the office with any questions or concerns that should arise related to todays visit.    Counseling:    Orders Placed This Encounter  Procedures   Ambulatory referral to Gastroenterology    Meds ordered this encounter  Medications   cyclobenzaprine (FLEXERIL) 5 MG tablet    Sig: Take 1 tablet (5 mg total) by mouth 3 (three) times daily as needed for muscle spasms.    Dispense:  30 tablet    Refill:  1    Time spent:30 Minutes

## 2023-07-27 ENCOUNTER — Ambulatory Visit: Payer: BC Managed Care – PPO | Admitting: Gastroenterology

## 2023-08-02 ENCOUNTER — Encounter: Payer: Self-pay | Admitting: Gastroenterology

## 2023-08-02 ENCOUNTER — Ambulatory Visit: Payer: BC Managed Care – PPO | Admitting: Gastroenterology

## 2023-08-02 VITALS — BP 150/103 | HR 48 | Temp 98.2°F | Ht 68.0 in | Wt 203.0 lb

## 2023-08-02 DIAGNOSIS — R143 Flatulence: Secondary | ICD-10-CM

## 2023-08-02 DIAGNOSIS — R197 Diarrhea, unspecified: Secondary | ICD-10-CM

## 2023-08-02 NOTE — Progress Notes (Signed)
Gastroenterology Consultation  Referring Provider:     Sallyanne Kuster, NP Primary Care Physician:  Sallyanne Kuster, NP Primary Gastroenterologist:  Dr. Servando Snare     Reason for Consultation:     Change in bowel habits        HPI:   Joshua Cooke is a 52 y.o. y/o male referred for consultation & management of change in bowel habits by Dr. Sallyanne Kuster, NP.  This patient comes today after being seen in urgent care at the beginning of October and then a few days later by his primary care provider.  The patient had reported crampy abdominal pain in the epigastric region with the next day turning to some right upper quadrant pain that he reported to be a dull pain that range from 2 out of 10 to 5-6 out of 10.  The patient had a right upper quadrant ultrasound that showed no gallstones and mildly elevated liver enzymes were found on labs with the finding of hepatic steatosis.  The patient reported that the right upper quadrant pain radiated to the shoulder.  The patient also reported that he has nonsolid stools within 20 minutes of eating and this has been going on for approximately 1 year.  He also has a history of heartburn and reported that it had become worse over the last month and his primary started him on 20 mg of omeprazole.  The patient had a Cologuard test 8 months ago which was reported to be negative. The RUQ pain has resolved. Most of his diarrhea is in the morning.  The patient reports that after having a few bowel movements in the morning he will go to lunch and usually when he goes out to lunch she will have loose bowel movements.  He states it also happens when he is stressed and has a meeting or has to do public speaking.  He states that by the evening his diarrhea has resolved.  There is also some gas with the diarrhea.  He does partake in dairy intake with mostly cheese but states he does not drink milk.  Past Medical History:  Diagnosis Date   Hyperlipidemia     Past  Surgical History:  Procedure Laterality Date   TYMPANOSTOMY TUBE PLACEMENT Bilateral     Prior to Admission medications   Medication Sig Start Date End Date Taking? Authorizing Provider  cyclobenzaprine (FLEXERIL) 5 MG tablet Take 1 tablet (5 mg total) by mouth 3 (three) times daily as needed for muscle spasms. 06/22/23   McDonough, Salomon Fick, PA-C  triamcinolone cream (KENALOG) 0.1 % Apply 1 Application topically 2 (two) times daily. To affected area until resolved. 09/27/22   Sallyanne Kuster, NP    Family History  Problem Relation Age of Onset   Hyperlipidemia Father    Heart disease Father      Social History   Tobacco Use   Smoking status: Former   Smokeless tobacco: Never   Tobacco comments:    stopped about 7-8 years ago reported on 08/09/2019  Vaping Use   Vaping status: Never Used  Substance Use Topics   Alcohol use: Yes    Alcohol/week: 3.0 standard drinks of alcohol    Types: 3 Cans of beer per week   Drug use: Never    Allergies as of 08/02/2023   (No Known Allergies)    Review of Systems:    All systems reviewed and negative except where noted in HPI.   Physical Exam:  There were no  vitals taken for this visit. No LMP for male patient. General:   Alert,  Well-developed, well-nourished, pleasant and cooperative in NAD Head:  Normocephalic and atraumatic. Eyes:  Sclera clear, no icterus.   Conjunctiva pink. Ears:  Normal auditory acuity. Rectal:  Deferred.  Pulses:  Normal pulses noted. Extremities:  No clubbing or edema.  No cyanosis. Neurologic:  Alert and oriented x3;  grossly normal neurologically. Psych:  Alert and cooperative. Normal mood and affect.  Imaging Studies: No results found.  Assessment and Plan:   NAYSHAWN BUMBALOUGH is a 52 y.o. y/o male who comes in today with a history of diarrhea with gas usually in the morning.  The patient does have dairy intake.  The patient has been told that his symptoms are likely a combination of food  intolerance with irritable bowel syndrome since it is associated with stress.  Lactose intolerance can explain the gas and urgency.  The patient has been told that he should avoid milk products for 1 week to see if his symptoms get better.  If they do then he has been told to start taking Lactaid pills with his dairy intake.  He has also been told that if it does not help he can go back on milk products and he may try Imodium.  The patient has a lack of any worry symptoms such as family history of colon cancer or colon polyps.  The patient also does not have any history of rectal bleeding or unexplained weight loss.  The patient has been explained the plan and agrees with it.    Midge Minium, MD. Clementeen Graham    Note: This dictation was prepared with Dragon dictation along with smaller phrase technology. Any transcriptional errors that result from this process are unintentional.

## 2023-10-09 ENCOUNTER — Encounter: Payer: Self-pay | Admitting: Nurse Practitioner

## 2023-10-12 ENCOUNTER — Ambulatory Visit
Admission: RE | Admit: 2023-10-12 | Discharge: 2023-10-12 | Disposition: A | Discharge status: Home / Self Care | Payer: BC Managed Care – PPO | Source: Ambulatory Visit | Attending: Emergency Medicine | Admitting: Emergency Medicine

## 2023-10-12 ENCOUNTER — Telehealth: Payer: Self-pay | Admitting: Nurse Practitioner

## 2023-10-12 VITALS — BP 169/95 | HR 64 | Temp 98.0°F | Resp 16

## 2023-10-12 DIAGNOSIS — J22 Unspecified acute lower respiratory infection: Secondary | ICD-10-CM | POA: Diagnosis not present

## 2023-10-12 MED ORDER — DOXYCYCLINE HYCLATE 100 MG PO CAPS: 100.0000 mg | ORAL_CAPSULE | Freq: Two times a day (BID) | ORAL | 0 refills | Status: AC

## 2023-10-12 NOTE — ED Provider Notes (Signed)
[facility]    CSN: [id number] Arrival date & time: 10/12/23  1150      History   Chief Complaint Chief Complaint  Patient presents with   Nasal Congestion    Became ill three some weeks ago with a lot of congestion and mild coughing. Still blowing my nose and cough up phlegm. Typical cold for me is 2-weeks and back to 100%. I don't feel terrible but stuck at 85%. - Entered by patient   Cough    HPI Joshua Cooke is a 53 y.o. male.   53 year old male patient, Joshua Cooke, presents to urgent care for evaluation of cough, nasal congestion for several weeks.  Patient states he has tried over-the-counter management without improvement.  Patient tried to get in with PCP was unable to get an appointment  Pmh: Hyperlipidemia  The history is provided by the patient. No language interpreter was used.  Cough Associated symptoms: no fever, no shortness of breath and no wheezing     Past Medical History:  Diagnosis Date   Hyperlipidemia     Patient Active Problem List   Diagnosis Date Noted   Acute respiratory infection 10/12/2023   Patellofemoral stress syndrome 09/15/2021   Onychomycosis of great toe 09/08/2020   Screening for colon cancer 09/08/2020   Hyperkalemia 09/09/2019   Dysuria 08/18/2019   Orthostatic hypotension 08/18/2019   Near syncope 08/18/2019   Encounter for general adult medical examination with abnormal findings 08/06/2018   Lipoma of neck 08/06/2018   Hyperlipidemia 08/06/2018   Bell's palsy 10/19/2016    Past Surgical History:  Procedure Laterality Date   TYMPANOSTOMY TUBE PLACEMENT Bilateral        Home Medications    Prior to Admission medications   Medication Sig Start Date End Date Taking? Authorizing Provider  doxycycline (VIBRAMYCIN) 100 MG capsule Take 1 capsule (100 mg total) by mouth 2 (two) times daily for 7 days. 10/12/23 10/19/23 Yes Adelita Hone, Para March, NP    Family History Family History  Problem Relation Age of  Onset   Hyperlipidemia Father    Heart disease Father     Social History Social History   Tobacco Use   Smoking status: Former   Smokeless tobacco: Never   Tobacco comments:    stopped about 7-8 years ago reported on 08/09/2019  Vaping Use   Vaping status: Never Used  Substance Use Topics   Alcohol use: Yes    Alcohol/week: 3.0 standard drinks of alcohol    Types: 3 Cans of beer per week   Drug use: Never     Allergies   Patient has no known allergies.   Review of Systems Review of Systems  Constitutional:  Negative for fever.  HENT:  Positive for congestion.   Respiratory:  Positive for cough. Negative for shortness of breath and wheezing.   All other systems reviewed and are negative.    Physical Exam Triage Vital Signs ED Triage Vitals  Encounter Vitals Group     BP 10/12/23 1225 (!) 169/95     Systolic BP Percentile --      Diastolic BP Percentile --      Pulse Rate 10/12/23 1225 64     Resp 10/12/23 1225 16     Temp 10/12/23 1225 98 F (36.7 C)     Temp Source 10/12/23 1225 Oral     SpO2 10/12/23 1225 95 %     Weight --      Height --  Head Circumference --      Peak Flow --      Pain Score 10/12/23 1223 0     Pain Loc --      Pain Education --      Exclude from Growth Chart --    No data found.  Updated Vital Signs BP (!) 169/95 (BP Location: Right Arm)   Pulse 64   Temp 98 F (36.7 C) (Oral)   Resp 16   SpO2 95%   Visual Acuity Right Eye Distance:   Left Eye Distance:   Bilateral Distance:    Right Eye Near:   Left Eye Near:    Bilateral Near:     Physical Exam Vitals and nursing note reviewed.  Constitutional:      General: He is not in acute distress.    Appearance: He is well-developed. He is not ill-appearing or toxic-appearing.  HENT:     Head: Normocephalic.     Right Ear: Tympanic membrane is retracted.     Left Ear: Tympanic membrane is retracted.     Nose: Mucosal edema and congestion present.     Right Sinus:  Maxillary sinus tenderness present.     Left Sinus: Maxillary sinus tenderness present.     Mouth/Throat:     Mouth: Mucous membranes are moist.     Pharynx: Uvula midline.  Eyes:     General: Lids are normal.     Conjunctiva/sclera: Conjunctivae normal.     Pupils: Pupils are equal, round, and reactive to light.  Cardiovascular:     Rate and Rhythm: Normal rate and regular rhythm.     Heart sounds: Normal heart sounds.  Pulmonary:     Effort: Pulmonary effort is normal. No respiratory distress.     Breath sounds: Normal breath sounds and air entry. No decreased breath sounds or wheezing.  Abdominal:     General: There is no distension.     Palpations: Abdomen is soft.  Musculoskeletal:        General: Normal range of motion.     Cervical back: Normal range of motion.  Skin:    General: Skin is warm and dry.     Findings: No rash.  Neurological:     General: No focal deficit present.     Mental Status: He is alert and oriented to person, place, and time.     GCS: GCS eye subscore is 4. GCS verbal subscore is 5. GCS motor subscore is 6.     Cranial Nerves: No cranial nerve deficit.     Sensory: No sensory deficit.  Psychiatric:        Speech: Speech normal.        Behavior: Behavior normal. Behavior is cooperative.      UC Treatments / Results  Labs (all labs ordered are listed, but only abnormal results are displayed) Labs Reviewed - No data to display  EKG   Radiology No results found.  Procedures Procedures (including critical care time)  Medications Ordered in UC Medications - No data to display  Initial Impression / Assessment and Plan / UC Course  I have reviewed the triage vital signs and the nursing notes.  Pertinent labs & imaging results that were available during my care of the patient were reviewed by me and considered in my medical decision making (see chart for details).    Discussed exam findings and plan of care with patient, scripted  doxycycline , strict go to ER precautions given.  Patient verbalized understanding to this provider.  Ddx: Acute respiratory infection, sinusitis , viral illness, allergies Final Clinical Impressions(s) / UC Diagnoses   Final diagnoses:  Acute respiratory infection     Discharge Instructions      Take doxycycline as directed, push fluids,follow up with PCP. Return as needed.     ED Prescriptions     Medication Sig Dispense Auth. Provider   doxycycline (VIBRAMYCIN) 100 MG capsule Take 1 capsule (100 mg total) by mouth 2 (two) times daily for 7 days. 14 capsule Sou Nohr, Para March, NP      PDMP not reviewed this encounter.   Clancy Gourd, NP 10/12/23 [phone]

## 2023-10-12 NOTE — Telephone Encounter (Signed)
Lvm & sent mychart msg to move 11/17/23 appointment-Toni

## 2023-10-12 NOTE — Discharge Instructions (Signed)
Take doxycycline as directed, push fluids,follow up with PCP. Return as needed.

## 2023-10-12 NOTE — ED Triage Notes (Signed)
Pt c/o cough, runny nose and chest congestion x 1 month.

## 2023-10-21 ENCOUNTER — Telehealth: Payer: BC Managed Care – PPO

## 2023-10-21 DIAGNOSIS — J069 Acute upper respiratory infection, unspecified: Secondary | ICD-10-CM | POA: Diagnosis not present

## 2023-10-21 MED ORDER — DOXYCYCLINE HYCLATE 100 MG PO TABS: 100.0000 mg | ORAL_TABLET | Freq: Two times a day (BID) | ORAL | 0 refills | Status: AC

## 2023-10-21 NOTE — Progress Notes (Signed)
Virtual Visit Consent   Joshua Cooke, you are scheduled for a virtual visit with a [facility] provider today. Just as with appointments in the office, your consent must be obtained to participate. Your consent will be active for this visit and any virtual visit you may have with one of our providers in the next 365 days. If you have a MyChart account, a copy of this consent can be sent to you electronically.  As this is a virtual visit, video technology does not allow for your provider to perform a traditional examination. This may limit your provider's ability to fully assess your condition. If your provider identifies any concerns that need to be evaluated in person or the need to arrange testing (such as labs, EKG, etc.), we will make arrangements to do so. Although advances in technology are sophisticated, we cannot ensure that it will always work on either your end or our end. If the connection with a video visit is poor, the visit may have to be switched to a telephone visit. With either a video or telephone visit, we are not always able to ensure that we have a secure connection.  By engaging in this virtual visit, you consent to the provision of healthcare and authorize for your insurance to be billed (if applicable) for the services provided during this visit. Depending on your insurance coverage, you may receive a charge related to this service.  I need to obtain your verbal consent now. Are you willing to proceed with your visit today? Joshua Cooke has provided verbal consent on 10/21/2023 for a virtual visit (video or telephone). Georgana Curio, FNP  Date: 10/21/2023 9:30 AM  Virtual Visit via Video Note   I, Georgana Curio, connected with  Joshua Cooke  ([id number], 1971-03-19) on 10/21/23 at  9:30 AM EST by a video-enabled telemedicine application and verified that I am speaking with the correct person using two identifiers.  Location: Patient: Home Provider: Virtual Visit Location  Provider: Home Office   I discussed the limitations of evaluation and management by telemedicine and the availability of in person appointments. The patient expressed understanding and agreed to proceed.    History of Present Illness: Joshua Cooke is a 53 y.o. who identifies as a male who was assigned male at birth, and is being seen today for contiinued cough and head congestion. He is on 7th days of doxy and says he is much better but still have some sx and would like to extend med. No fever. In no distress. Marland Kitchen  HPI: HPI  Problems:  Patient Active Problem List   Diagnosis Date Noted   Acute respiratory infection 10/12/2023   Patellofemoral stress syndrome 09/15/2021   Onychomycosis of great toe 09/08/2020   Screening for colon cancer 09/08/2020   Hyperkalemia 09/09/2019   Dysuria 08/18/2019   Orthostatic hypotension 08/18/2019   Near syncope 08/18/2019   Encounter for general adult medical examination with abnormal findings 08/06/2018   Lipoma of neck 08/06/2018   Hyperlipidemia 08/06/2018   Bell's palsy 10/19/2016    Allergies: No Known Allergies Medications:  Current Outpatient Medications:    doxycycline (VIBRA-TABS) 100 MG tablet, Take 1 tablet (100 mg total) by mouth 2 (two) times daily for 3 days., Disp: 6 tablet, Rfl: 0  Observations/Objective: Patient is well-developed, well-nourished in no acute distress.  Resting comfortably  at home.  Head is normocephalic, atraumatic.  No labored breathing.  Speech is clear and coherent with logical content.  Patient is  alert and oriented at baseline.   Assessment and Plan: 1. Upper respiratory tract infection, unspecified type (Primary)  Increase fluids, humidifier at night, tylenol or ibuprofen as directed, UC if sx worsen.   Follow Up Instructions: I discussed the assessment and treatment plan with the patient. The patient was provided an opportunity to ask questions and all were answered. The patient agreed with the plan  and demonstrated an understanding of the instructions.  A copy of instructions were sent to the patient via MyChart unless otherwise noted below.     The patient was advised to call back or seek an in-person evaluation if the symptoms worsen or if the condition fails to improve as anticipated.    Georgana Curio, FNP

## 2023-10-21 NOTE — Patient Instructions (Signed)

## 2023-11-17 ENCOUNTER — Encounter: Payer: BC Managed Care – PPO | Admitting: Nurse Practitioner

## 2023-11-22 ENCOUNTER — Encounter: Payer: BC Managed Care – PPO | Admitting: Nurse Practitioner

## 2023-11-24 DIAGNOSIS — S41132A Puncture wound without foreign body of left upper arm, initial encounter: Secondary | ICD-10-CM | POA: Diagnosis not present

## 2023-11-24 DIAGNOSIS — S199XXA Unspecified injury of neck, initial encounter: Secondary | ICD-10-CM | POA: Diagnosis not present

## 2023-11-24 DIAGNOSIS — S3993XA Unspecified injury of pelvis, initial encounter: Secondary | ICD-10-CM | POA: Diagnosis not present

## 2023-11-24 DIAGNOSIS — S41012A Laceration without foreign body of left shoulder, initial encounter: Secondary | ICD-10-CM | POA: Diagnosis not present

## 2023-11-24 DIAGNOSIS — S36893A Laceration of other intra-abdominal organs, initial encounter: Secondary | ICD-10-CM | POA: Diagnosis not present

## 2023-11-24 DIAGNOSIS — S3992XA Unspecified injury of lower back, initial encounter: Secondary | ICD-10-CM | POA: Diagnosis not present

## 2023-11-24 DIAGNOSIS — R0603 Acute respiratory distress: Secondary | ICD-10-CM | POA: Diagnosis not present

## 2023-11-24 DIAGNOSIS — S0181XA Laceration without foreign body of other part of head, initial encounter: Secondary | ICD-10-CM | POA: Diagnosis not present

## 2023-11-24 DIAGNOSIS — R109 Unspecified abdominal pain: Secondary | ICD-10-CM | POA: Diagnosis not present

## 2023-11-24 DIAGNOSIS — S01111A Laceration without foreign body of right eyelid and periocular area, initial encounter: Secondary | ICD-10-CM | POA: Diagnosis not present

## 2023-11-24 DIAGNOSIS — R140 Abdominal distension (gaseous): Secondary | ICD-10-CM | POA: Diagnosis not present

## 2023-11-24 DIAGNOSIS — S0191XA Laceration without foreign body of unspecified part of head, initial encounter: Secondary | ICD-10-CM | POA: Diagnosis not present

## 2023-11-24 DIAGNOSIS — S41112A Laceration without foreign body of left upper arm, initial encounter: Secondary | ICD-10-CM | POA: Diagnosis not present

## 2023-11-24 DIAGNOSIS — E874 Mixed disorder of acid-base balance: Secondary | ICD-10-CM | POA: Diagnosis not present

## 2023-11-24 DIAGNOSIS — S21119A Laceration without foreign body of unspecified front wall of thorax without penetration into thoracic cavity, initial encounter: Secondary | ICD-10-CM | POA: Diagnosis not present

## 2023-11-24 DIAGNOSIS — D689 Coagulation defect, unspecified: Secondary | ICD-10-CM | POA: Diagnosis not present

## 2023-11-24 DIAGNOSIS — S3991XA Unspecified injury of abdomen, initial encounter: Secondary | ICD-10-CM | POA: Diagnosis not present

## 2023-11-24 DIAGNOSIS — S0101XA Laceration without foreign body of scalp, initial encounter: Secondary | ICD-10-CM | POA: Diagnosis not present

## 2023-11-24 DIAGNOSIS — T8131XA Disruption of external operation (surgical) wound, not elsewhere classified, initial encounter: Secondary | ICD-10-CM | POA: Diagnosis not present

## 2023-11-24 DIAGNOSIS — S299XXA Unspecified injury of thorax, initial encounter: Secondary | ICD-10-CM | POA: Diagnosis not present

## 2023-11-24 DIAGNOSIS — E8729 Other acidosis: Secondary | ICD-10-CM | POA: Diagnosis not present

## 2023-11-24 DIAGNOSIS — T797XXA Traumatic subcutaneous emphysema, initial encounter: Secondary | ICD-10-CM | POA: Diagnosis not present

## 2023-11-24 DIAGNOSIS — S41119A Laceration without foreign body of unspecified upper arm, initial encounter: Secondary | ICD-10-CM | POA: Diagnosis not present

## 2023-11-24 DIAGNOSIS — S31142A Puncture wound of abdominal wall with foreign body, epigastric region without penetration into peritoneal cavity, initial encounter: Secondary | ICD-10-CM | POA: Diagnosis not present

## 2023-11-24 DIAGNOSIS — Z452 Encounter for adjustment and management of vascular access device: Secondary | ICD-10-CM | POA: Diagnosis not present

## 2023-11-24 DIAGNOSIS — S2190XA Unspecified open wound of unspecified part of thorax, initial encounter: Secondary | ICD-10-CM | POA: Diagnosis not present

## 2023-11-24 DIAGNOSIS — S41111A Laceration without foreign body of right upper arm, initial encounter: Secondary | ICD-10-CM | POA: Diagnosis not present

## 2023-11-24 DIAGNOSIS — S270XXA Traumatic pneumothorax, initial encounter: Secondary | ICD-10-CM | POA: Diagnosis not present

## 2023-11-24 DIAGNOSIS — S41131A Puncture wound without foreign body of right upper arm, initial encounter: Secondary | ICD-10-CM | POA: Diagnosis not present

## 2023-11-24 DIAGNOSIS — I959 Hypotension, unspecified: Secondary | ICD-10-CM | POA: Diagnosis not present

## 2023-11-24 DIAGNOSIS — T8130XA Disruption of wound, unspecified, initial encounter: Secondary | ICD-10-CM | POA: Diagnosis not present

## 2023-11-24 DIAGNOSIS — Y838 Other surgical procedures as the cause of abnormal reaction of the patient, or of later complication, without mention of misadventure at the time of the procedure: Secondary | ICD-10-CM | POA: Diagnosis not present

## 2023-11-24 DIAGNOSIS — T8141XA Infection following a procedure, superficial incisional surgical site, initial encounter: Secondary | ICD-10-CM | POA: Diagnosis not present

## 2023-11-24 DIAGNOSIS — R61 Generalized hyperhidrosis: Secondary | ICD-10-CM | POA: Diagnosis not present

## 2023-11-24 DIAGNOSIS — S272XXA Traumatic hemopneumothorax, initial encounter: Secondary | ICD-10-CM | POA: Diagnosis not present

## 2023-11-24 DIAGNOSIS — S31611A Laceration without foreign body of abdominal wall, left upper quadrant with penetration into peritoneal cavity, initial encounter: Secondary | ICD-10-CM | POA: Diagnosis not present

## 2023-11-24 DIAGNOSIS — T07XXXA Unspecified multiple injuries, initial encounter: Secondary | ICD-10-CM | POA: Diagnosis not present

## 2023-11-24 DIAGNOSIS — S0990XA Unspecified injury of head, initial encounter: Secondary | ICD-10-CM | POA: Diagnosis not present

## 2023-11-24 DIAGNOSIS — R0902 Hypoxemia: Secondary | ICD-10-CM | POA: Diagnosis not present

## 2023-11-24 DIAGNOSIS — K567 Ileus, unspecified: Secondary | ICD-10-CM | POA: Diagnosis not present

## 2023-11-24 DIAGNOSIS — S21301A Unspecified open wound of right front wall of thorax with penetration into thoracic cavity, initial encounter: Secondary | ICD-10-CM | POA: Diagnosis not present

## 2023-11-24 DIAGNOSIS — S2191XA Laceration without foreign body of unspecified part of thorax, initial encounter: Secondary | ICD-10-CM | POA: Diagnosis not present

## 2023-11-24 DIAGNOSIS — R918 Other nonspecific abnormal finding of lung field: Secondary | ICD-10-CM | POA: Diagnosis not present

## 2023-11-24 DIAGNOSIS — S36892A Contusion of other intra-abdominal organs, initial encounter: Secondary | ICD-10-CM | POA: Diagnosis not present

## 2023-11-24 DIAGNOSIS — J9811 Atelectasis: Secondary | ICD-10-CM | POA: Diagnosis not present

## 2023-11-24 DIAGNOSIS — S3590XA Unspecified injury of unspecified blood vessel at abdomen, lower back and pelvis level, initial encounter: Secondary | ICD-10-CM | POA: Diagnosis not present

## 2023-11-24 DIAGNOSIS — S21111A Laceration without foreign body of right front wall of thorax without penetration into thoracic cavity, initial encounter: Secondary | ICD-10-CM | POA: Diagnosis not present

## 2023-11-24 DIAGNOSIS — S2193XA Puncture wound without foreign body of unspecified part of thorax, initial encounter: Secondary | ICD-10-CM | POA: Diagnosis not present

## 2023-11-24 DIAGNOSIS — S31119A Laceration without foreign body of abdominal wall, unspecified quadrant without penetration into peritoneal cavity, initial encounter: Secondary | ICD-10-CM | POA: Diagnosis not present

## 2023-11-24 DIAGNOSIS — K9189 Other postprocedural complications and disorders of digestive system: Secondary | ICD-10-CM | POA: Diagnosis not present

## 2023-11-24 DIAGNOSIS — K760 Fatty (change of) liver, not elsewhere classified: Secondary | ICD-10-CM | POA: Diagnosis not present

## 2023-11-24 DIAGNOSIS — R578 Other shock: Secondary | ICD-10-CM | POA: Diagnosis not present

## 2023-11-24 DIAGNOSIS — J90 Pleural effusion, not elsewhere classified: Secondary | ICD-10-CM | POA: Diagnosis not present

## 2023-11-24 DIAGNOSIS — J939 Pneumothorax, unspecified: Secondary | ICD-10-CM | POA: Diagnosis not present

## 2023-11-24 DIAGNOSIS — N179 Acute kidney failure, unspecified: Secondary | ICD-10-CM | POA: Diagnosis not present

## 2023-11-24 DIAGNOSIS — D62 Acute posthemorrhagic anemia: Secondary | ICD-10-CM | POA: Diagnosis not present

## 2023-11-24 DIAGNOSIS — Z4682 Encounter for fitting and adjustment of non-vascular catheter: Secondary | ICD-10-CM | POA: Diagnosis not present

## 2023-11-27 ENCOUNTER — Telehealth: Payer: Self-pay | Admitting: Nurse Practitioner

## 2023-11-27 NOTE — Telephone Encounter (Signed)
 Left vm and sent mychart message to confirm 12/06/23 appointment-Toni

## 2023-12-05 DIAGNOSIS — T8131XA Disruption of external operation (surgical) wound, not elsewhere classified, initial encounter: Secondary | ICD-10-CM | POA: Diagnosis not present

## 2023-12-05 DIAGNOSIS — S31113A Laceration without foreign body of abdominal wall, right lower quadrant without penetration into peritoneal cavity, initial encounter: Secondary | ICD-10-CM | POA: Diagnosis not present

## 2023-12-05 DIAGNOSIS — T8149XA Infection following a procedure, other surgical site, initial encounter: Secondary | ICD-10-CM | POA: Diagnosis not present

## 2023-12-05 DIAGNOSIS — Z4803 Encounter for change or removal of drains: Secondary | ICD-10-CM | POA: Diagnosis not present

## 2023-12-05 DIAGNOSIS — S31110A Laceration without foreign body of abdominal wall, right upper quadrant without penetration into peritoneal cavity, initial encounter: Secondary | ICD-10-CM | POA: Diagnosis not present

## 2023-12-05 DIAGNOSIS — S31112A Laceration without foreign body of abdominal wall, epigastric region without penetration into peritoneal cavity, initial encounter: Secondary | ICD-10-CM | POA: Diagnosis not present

## 2023-12-05 DIAGNOSIS — L03311 Cellulitis of abdominal wall: Secondary | ICD-10-CM | POA: Diagnosis not present

## 2023-12-06 ENCOUNTER — Encounter: Payer: BC Managed Care – PPO | Admitting: Nurse Practitioner

## 2023-12-07 DIAGNOSIS — S2191XD Laceration without foreign body of unspecified part of thorax, subsequent encounter: Secondary | ICD-10-CM | POA: Diagnosis not present

## 2023-12-07 DIAGNOSIS — S41111D Laceration without foreign body of right upper arm, subsequent encounter: Secondary | ICD-10-CM | POA: Diagnosis not present

## 2023-12-07 DIAGNOSIS — S41112D Laceration without foreign body of left upper arm, subsequent encounter: Secondary | ICD-10-CM | POA: Diagnosis not present

## 2023-12-07 DIAGNOSIS — X58XXXD Exposure to other specified factors, subsequent encounter: Secondary | ICD-10-CM | POA: Diagnosis not present

## 2023-12-07 DIAGNOSIS — S31119D Laceration without foreign body of abdominal wall, unspecified quadrant without penetration into peritoneal cavity, subsequent encounter: Secondary | ICD-10-CM | POA: Diagnosis not present

## 2023-12-07 DIAGNOSIS — S0101XD Laceration without foreign body of scalp, subsequent encounter: Secondary | ICD-10-CM | POA: Diagnosis not present

## 2023-12-08 DIAGNOSIS — S31109A Unspecified open wound of abdominal wall, unspecified quadrant without penetration into peritoneal cavity, initial encounter: Secondary | ICD-10-CM | POA: Diagnosis not present

## 2023-12-08 DIAGNOSIS — L03311 Cellulitis of abdominal wall: Secondary | ICD-10-CM | POA: Diagnosis not present

## 2023-12-08 DIAGNOSIS — Z4803 Encounter for change or removal of drains: Secondary | ICD-10-CM | POA: Diagnosis not present

## 2023-12-08 DIAGNOSIS — S31110A Laceration without foreign body of abdominal wall, right upper quadrant without penetration into peritoneal cavity, initial encounter: Secondary | ICD-10-CM | POA: Diagnosis not present

## 2023-12-08 DIAGNOSIS — T8149XA Infection following a procedure, other surgical site, initial encounter: Secondary | ICD-10-CM | POA: Diagnosis not present

## 2023-12-08 DIAGNOSIS — T8189XA Other complications of procedures, not elsewhere classified, initial encounter: Secondary | ICD-10-CM | POA: Diagnosis not present

## 2023-12-08 DIAGNOSIS — T8131XA Disruption of external operation (surgical) wound, not elsewhere classified, initial encounter: Secondary | ICD-10-CM | POA: Diagnosis not present

## 2023-12-08 DIAGNOSIS — S31113A Laceration without foreign body of abdominal wall, right lower quadrant without penetration into peritoneal cavity, initial encounter: Secondary | ICD-10-CM | POA: Diagnosis not present

## 2023-12-08 DIAGNOSIS — S31112A Laceration without foreign body of abdominal wall, epigastric region without penetration into peritoneal cavity, initial encounter: Secondary | ICD-10-CM | POA: Diagnosis not present

## 2023-12-14 DIAGNOSIS — X58XXXS Exposure to other specified factors, sequela: Secondary | ICD-10-CM | POA: Diagnosis not present

## 2023-12-14 DIAGNOSIS — S31119S Laceration without foreign body of abdominal wall, unspecified quadrant without penetration into peritoneal cavity, sequela: Secondary | ICD-10-CM | POA: Diagnosis not present

## 2023-12-21 ENCOUNTER — Ambulatory Visit: Admitting: Physician Assistant

## 2023-12-21 ENCOUNTER — Encounter: Payer: Self-pay | Admitting: Physician Assistant

## 2023-12-21 VITALS — BP 104/76 | HR 72 | Temp 98.3°F | Resp 16 | Ht 68.0 in | Wt 180.0 lb

## 2023-12-21 DIAGNOSIS — K760 Fatty (change of) liver, not elsewhere classified: Secondary | ICD-10-CM

## 2023-12-21 DIAGNOSIS — Z87828 Personal history of other (healed) physical injury and trauma: Secondary | ICD-10-CM | POA: Diagnosis not present

## 2023-12-21 DIAGNOSIS — Z09 Encounter for follow-up examination after completed treatment for conditions other than malignant neoplasm: Secondary | ICD-10-CM

## 2023-12-21 DIAGNOSIS — E782 Mixed hyperlipidemia: Secondary | ICD-10-CM

## 2023-12-21 DIAGNOSIS — K449 Diaphragmatic hernia without obstruction or gangrene: Secondary | ICD-10-CM | POA: Diagnosis not present

## 2023-12-21 DIAGNOSIS — Z1329 Encounter for screening for other suspected endocrine disorder: Secondary | ICD-10-CM

## 2023-12-21 DIAGNOSIS — Z125 Encounter for screening for malignant neoplasm of prostate: Secondary | ICD-10-CM

## 2023-12-21 DIAGNOSIS — R5383 Other fatigue: Secondary | ICD-10-CM
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
## 2023-12-28 DIAGNOSIS — X58XXXS Exposure to other specified factors, sequela: Secondary | ICD-10-CM | POA: Diagnosis not present

## 2023-12-28 DIAGNOSIS — S31119S Laceration without foreign body of abdominal wall, unspecified quadrant without penetration into peritoneal cavity, sequela: Secondary | ICD-10-CM | POA: Diagnosis not present

## 2024-01-10 ENCOUNTER — Telehealth: Payer: Self-pay | Admitting: Nurse Practitioner

## 2024-01-10 NOTE — Telephone Encounter (Signed)
 Left vm and sent mychart message to confirm 01/17/24 appointment-Joshua Cooke

## 2024-01-17 ENCOUNTER — Ambulatory Visit (INDEPENDENT_AMBULATORY_CARE_PROVIDER_SITE_OTHER): Admitting: Nurse Practitioner

## 2024-01-17 ENCOUNTER — Encounter: Payer: Self-pay | Admitting: Nurse Practitioner

## 2024-01-17 VITALS — BP 134/86 | HR 60 | Temp 98.8°F | Resp 16 | Ht 68.0 in | Wt 185.0 lb

## 2024-01-17 DIAGNOSIS — E782 Mixed hyperlipidemia: Secondary | ICD-10-CM | POA: Diagnosis not present

## 2024-01-17 DIAGNOSIS — Z0001 Encounter for general adult medical examination with abnormal findings: Secondary | ICD-10-CM | POA: Diagnosis not present

## 2024-01-17 DIAGNOSIS — Z87828 Personal history of other (healed) physical injury and trauma: Secondary | ICD-10-CM | POA: Diagnosis not present

## 2024-01-17 DIAGNOSIS — K760 Fatty (change of) liver, not elsewhere classified: Secondary | ICD-10-CM
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
## 2024-05-16 DIAGNOSIS — K432 Incisional hernia without obstruction or gangrene: Secondary | ICD-10-CM | POA: Diagnosis not present

## 2024-05-27 DIAGNOSIS — K432 Incisional hernia without obstruction or gangrene: Secondary | ICD-10-CM | POA: Diagnosis not present

## 2024-07-04 DIAGNOSIS — K439 Ventral hernia without obstruction or gangrene: Secondary | ICD-10-CM | POA: Diagnosis not present

## 2025-01-17 ENCOUNTER — Encounter: Admitting: Nurse Practitioner

## 2025-01-24 ENCOUNTER — Encounter: Admitting: Nurse Practitioner
